# Patient Record
Sex: Male | Born: 1980 | Race: Black or African American | Hispanic: No | Marital: Single | Smoking: Current every day smoker
Health system: Southern US, Community
[De-identification: ages and names within clinical notes are randomized; demographics above are authoritative.]

## PROBLEM LIST (undated history)

## (undated) ENCOUNTER — Emergency Department: Admission: EM | Payer: Self-pay | Source: Home / Self Care

## (undated) DIAGNOSIS — I10 Essential (primary) hypertension: Secondary | ICD-10-CM

## (undated) DIAGNOSIS — E785 Hyperlipidemia, unspecified: Secondary | ICD-10-CM

## (undated) DIAGNOSIS — E119 Type 2 diabetes mellitus without complications: Secondary | ICD-10-CM

## (undated) HISTORY — DX: Hyperlipidemia, unspecified: E78.5

---

## 2012-04-08 ENCOUNTER — Emergency Department: Payer: Self-pay | Admitting: Internal Medicine

## 2012-04-08 LAB — URINALYSIS, COMPLETE
Bilirubin,UR: NEGATIVE
Blood: NEGATIVE
Nitrite: NEGATIVE
Ph: 7 (ref 4.5–8.0)
RBC,UR: 10 /HPF (ref 0–5)
Specific Gravity: 1.028 (ref 1.003–1.030)
WBC UR: 1 /HPF (ref 0–5)

## 2012-04-08 LAB — BASIC METABOLIC PANEL
Anion Gap: 12 (ref 7–16)
Calcium, Total: 8.5 mg/dL (ref 8.5–10.1)
Chloride: 105 mmol/L (ref 98–107)
Co2: 21 mmol/L (ref 21–32)
Creatinine: 1.16 mg/dL (ref 0.60–1.30)
EGFR (African American): >60
EGFR (Non-African Amer.): >60
Glucose: 96 mg/dL (ref 65–99)
Potassium: 3.8 mmol/L (ref 3.5–5.1)
Sodium: 138 mmol/L (ref 136–145)

## 2012-04-08 LAB — CBC WITH DIFFERENTIAL/PLATELET
Basophil %: 0.6 %
Eosinophil #: 0 10*3/uL (ref 0.0–0.7)
Eosinophil %: 0.2 %
Lymphocyte %: 16 %
MCHC: 35.1 g/dL (ref 32.0–36.0)
Monocyte #: 0.7 x10 3/mm (ref 0.2–1.0)
Monocyte %: 6 %
Neutrophil #: 9.5 10*3/uL — ABNORMAL HIGH (ref 1.4–6.5)
Neutrophil %: 77.2 %
RBC: 5.12 10*6/uL (ref 4.40–5.90)
WBC: 12.4 10*3/uL — ABNORMAL HIGH (ref 3.8–10.6)

## 2012-11-12 ENCOUNTER — Emergency Department: Payer: Self-pay | Admitting: Emergency Medicine

## 2012-11-12 LAB — URINALYSIS, COMPLETE
Bilirubin,UR: NEGATIVE
Glucose,UR: NEGATIVE mg/dL (ref 0–75)
Ph: 5 (ref 4.5–8.0)
Protein: 100
RBC,UR: 16 /HPF (ref 0–5)
Squamous Epithelial: NONE SEEN
WBC UR: 2 /HPF (ref 0–5)

## 2012-11-12 LAB — CBC
HGB: 14.3 g/dL (ref 13.0–18.0)
MCH: 28.6 pg (ref 26.0–34.0)
MCHC: 35.7 g/dL (ref 32.0–36.0)
MCV: 80 fL (ref 80–100)
Platelet: 326 10*3/uL (ref 150–440)
RBC: 4.99 10*6/uL (ref 4.40–5.90)
RDW: 16 % — ABNORMAL HIGH (ref 11.5–14.5)
WBC: 13.3 10*3/uL — ABNORMAL HIGH (ref 3.8–10.6)

## 2012-11-12 LAB — COMPREHENSIVE METABOLIC PANEL
Albumin: 3.8 g/dL (ref 3.4–5.0)
Alkaline Phosphatase: 86 U/L (ref 50–136)
Anion Gap: 8 (ref 7–16)
BUN: 10 mg/dL (ref 7–18)
Bilirubin,Total: 1.5 mg/dL — ABNORMAL HIGH (ref 0.2–1.0)
Calcium, Total: 8.9 mg/dL (ref 8.5–10.1)
Co2: 25 mmol/L (ref 21–32)
Creatinine: 1.07 mg/dL (ref 0.60–1.30)
EGFR (Non-African Amer.): >60
Glucose: 90 mg/dL (ref 65–99)
Total Protein: 7.9 g/dL (ref 6.4–8.2)

## 2012-11-12 LAB — LIPASE, BLOOD: Lipase: 140 U/L (ref 73–393)

## 2012-11-14 LAB — URINE CULTURE

## 2014-05-09 IMAGING — US ABDOMEN ULTRASOUND LIMITED
1 series · 14 of 25 positions shown · non-contrast
Comparison: none

REASON FOR EXAM: RUQ pain eval T bili
COMMENTS:   Body Site: GB and Fossa, CBD, Head of Pancreas

PROCEDURE:     US  - US ABDOMEN LIMITED SURVEY  - November 13, 2012  [DATE]
RESULT:     History: Pain.
Comparison Study: CT 04/08/2012.

[Series 1: abdomen ultrasound limited · 0.33mm/px · 14 of 50 slices shown]
[im 1/50]
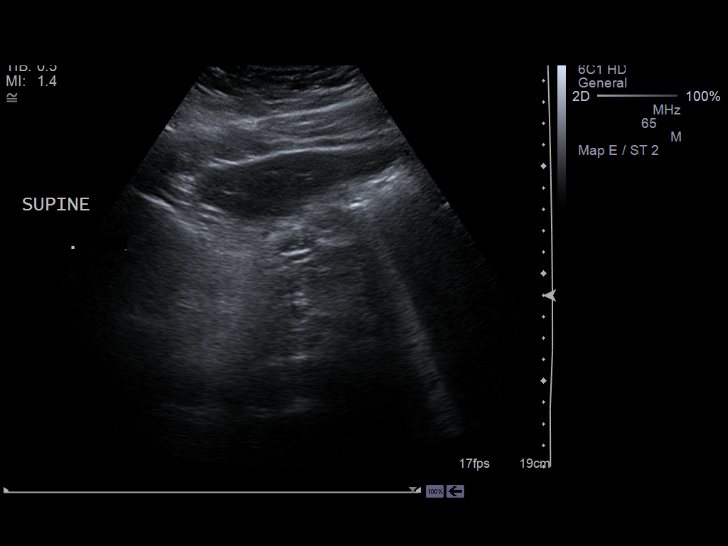
[im 5/50]
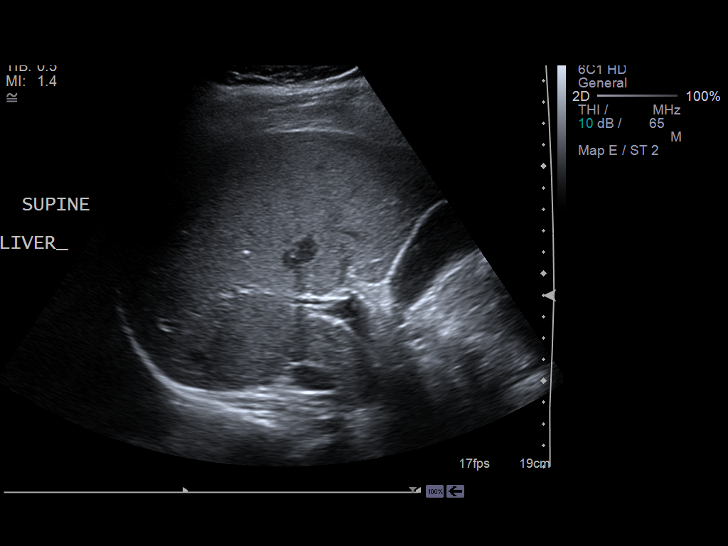
[im 9/50]
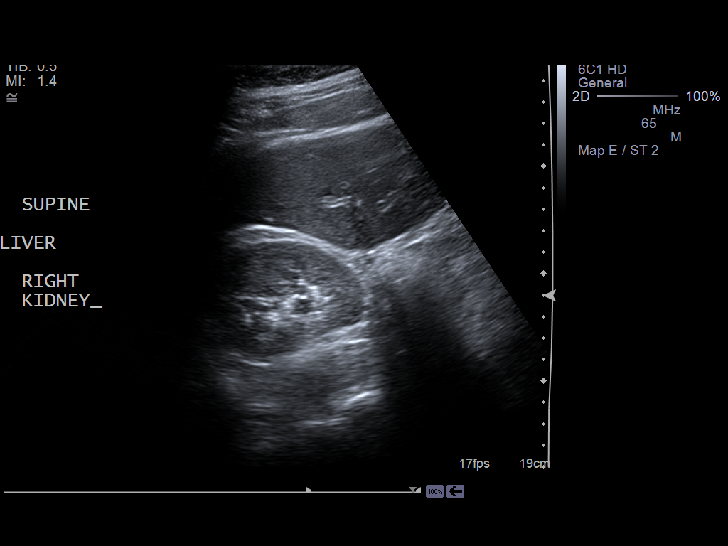
[im 13/50]
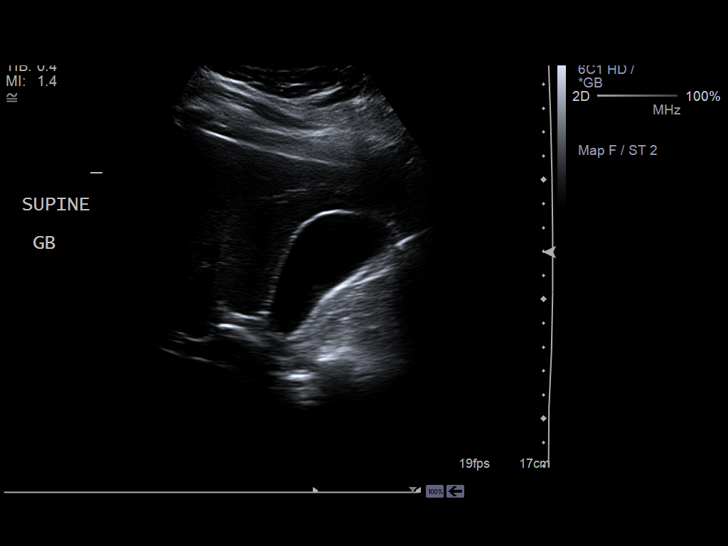
[im 17/50]
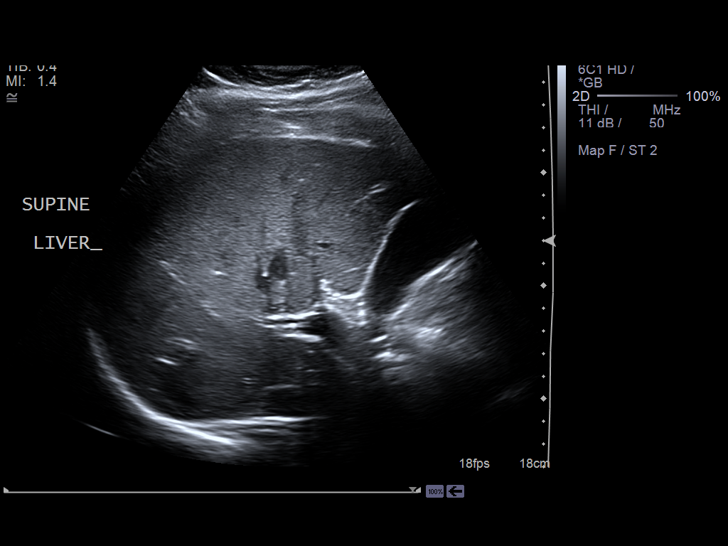
[im 19/50]
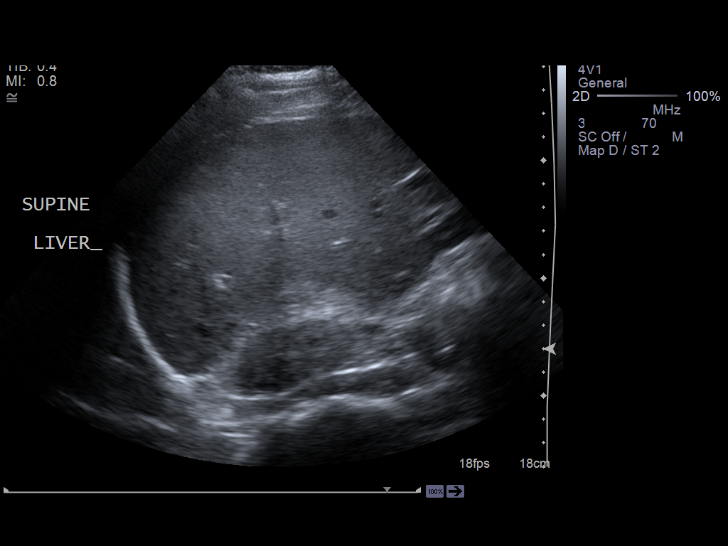
[im 23/50]
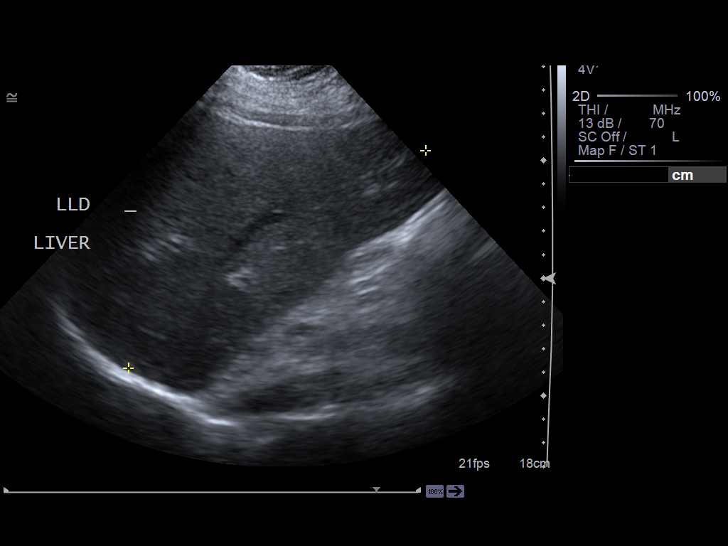
[im 27/50]
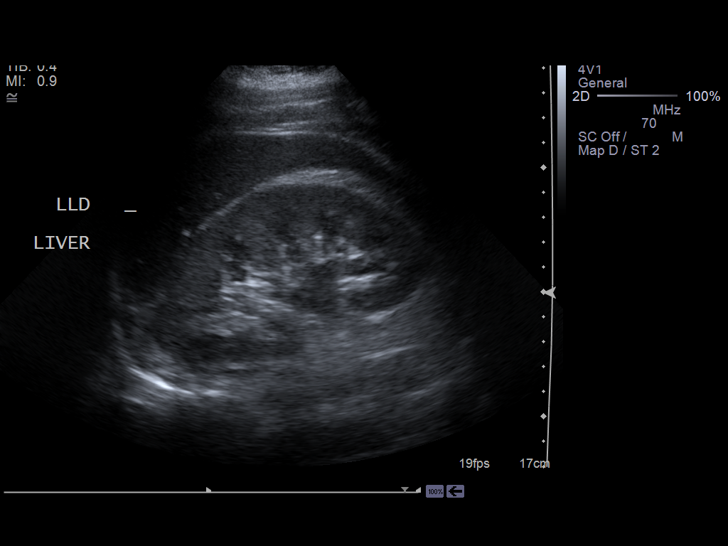
[im 31/50]
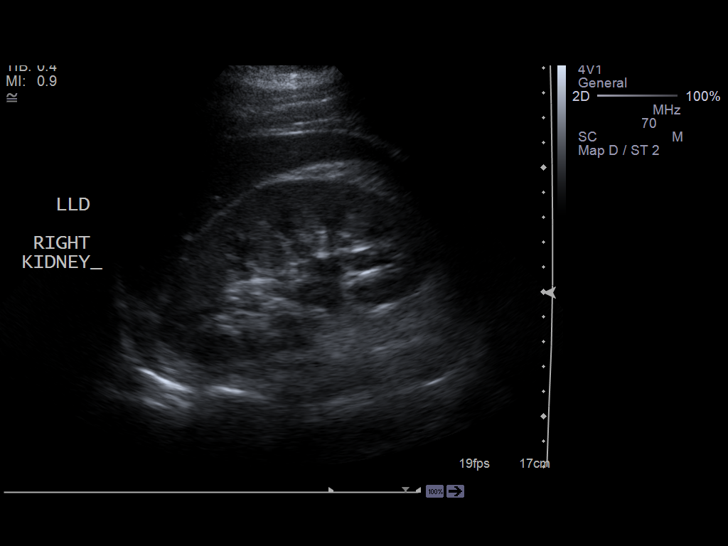
[im 33/50]
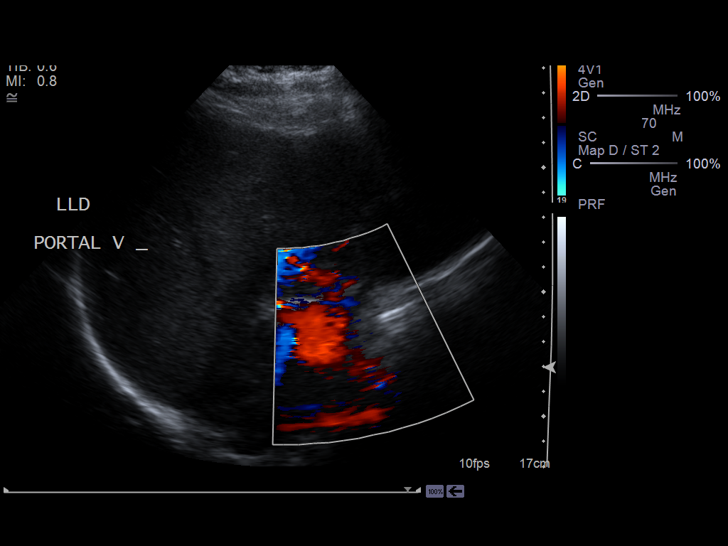
[im 37/50]
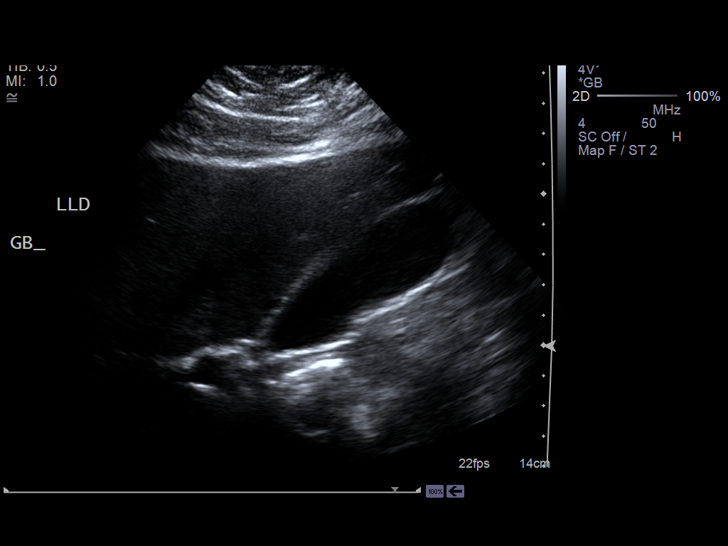
[im 41/50]
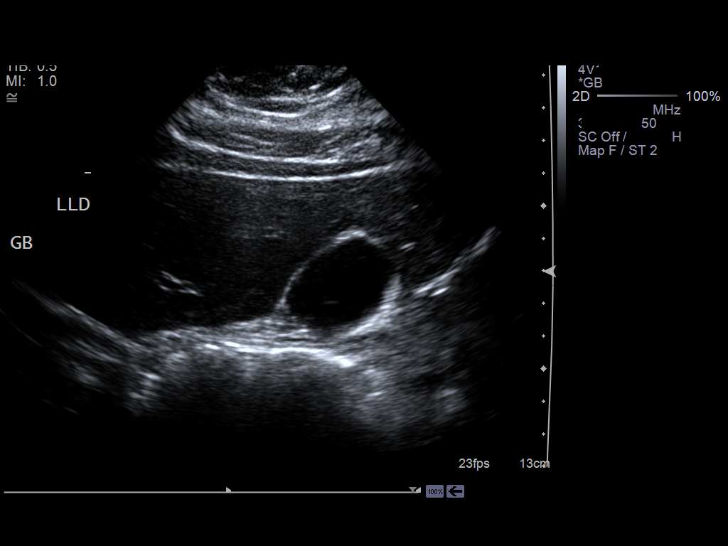
[im 45/50]
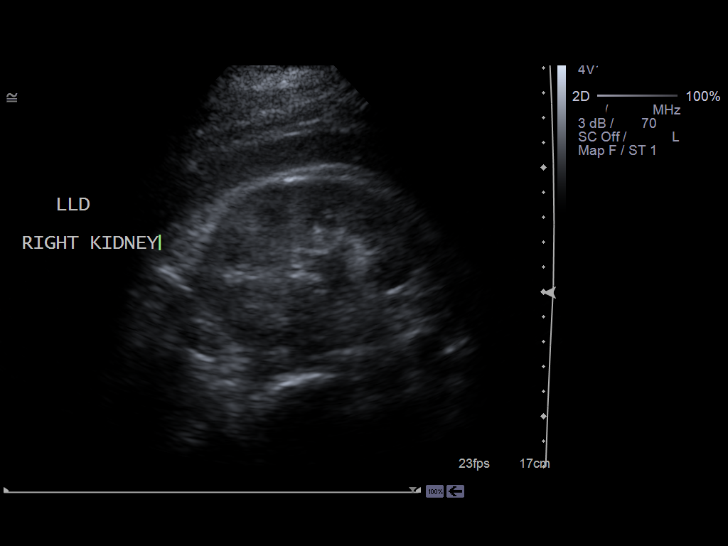
[im 50/50]
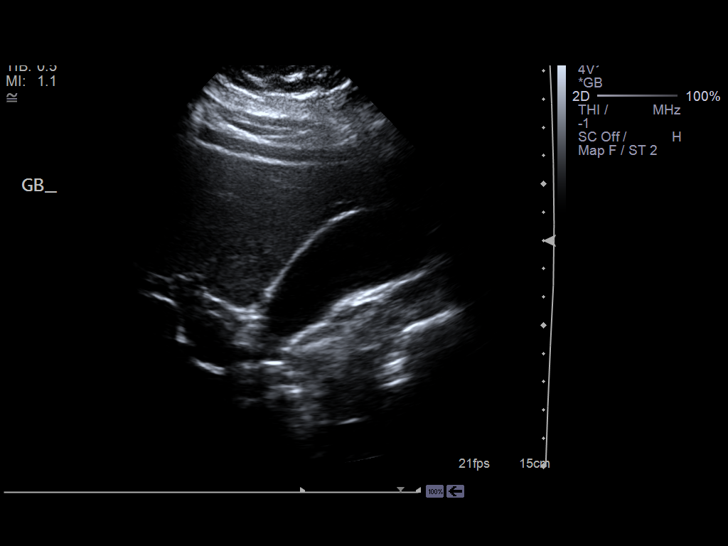

[14 of 25 positions shown; findings below may reference images not displayed]

FINDINGS: Liver normal. Portal vein patent. Pancreas not visualized.
Gallbladder normal. Negative Murphy's sign. Gallbladder wall thickness
mm. Common bile duct caliber 4.3 mm. Mild right hydronephrosis.
IMPRESSION: No acute abnormality. Mild right hydronephrosis.

## 2014-11-03 IMAGING — CT CT ABD-PELV W/ CM
1 of 2 series · 15 of 32 positions shown, 19 images · non-contrast
Comparison: none

REASON FOR EXAM: (1) abd pain with elevated WBC and hematuria; (2) same
COMMENTS:   May transport without cardiac monitor

PROCEDURE:     CT  - CT ABDOMEN / PELVIS  W  - April 08, 2012  [DATE]
RESULT:     CT abdomen and pelvis dated 04/08/2012
TECHNIQUE: Helical 3 mm sections were obtained from lung bases through the
pubic symphysis status post intravenous administration of 100 mL of
Wsovue-QSS and oral contrast.

[Series 2: 3mm soft tissue · axial · 0.86mm/px · z∈[-946,-505]mm · 15 of 162 slices shown, 19 images]
[im 8/162  soft-tissue]
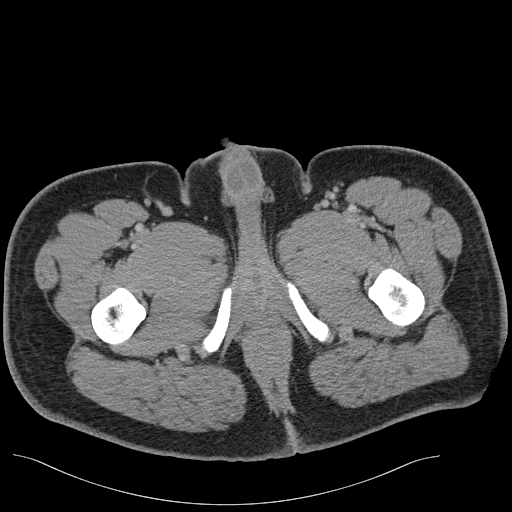
[im 8/162  bone]
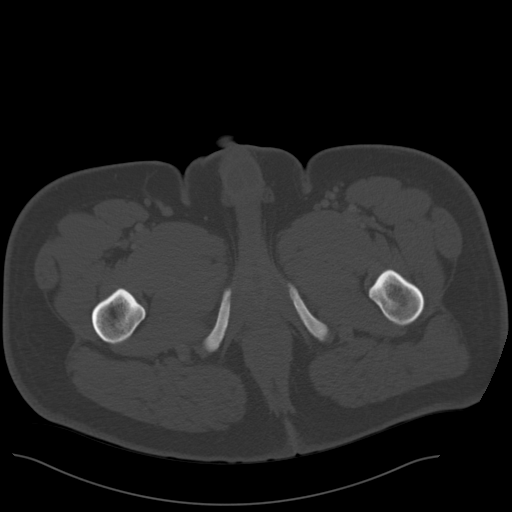
[im 22/162  soft-tissue]
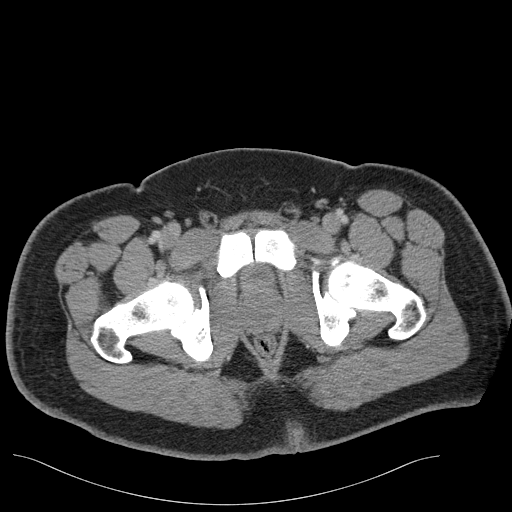
[im 36/162  soft-tissue]
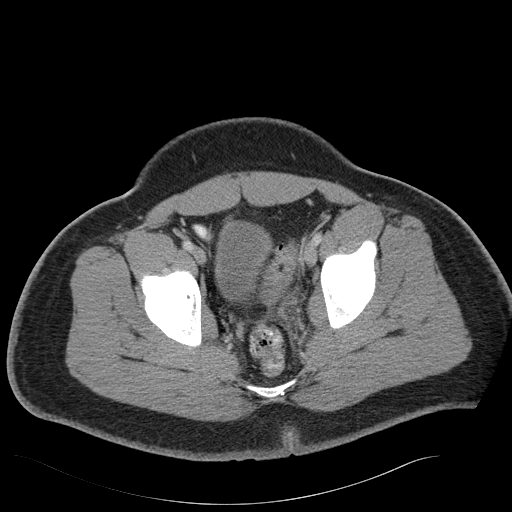
[im 43/162  soft-tissue]
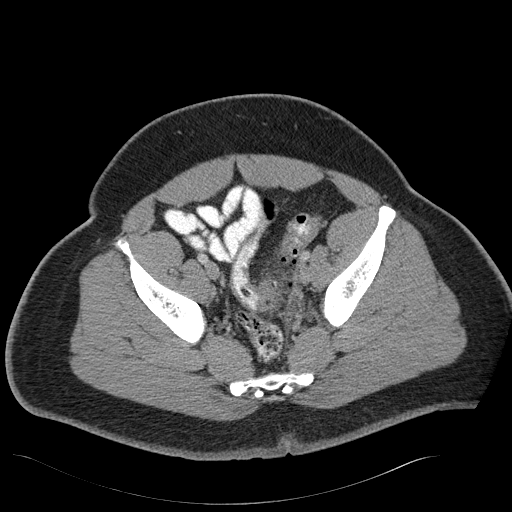
[im 57/162  soft-tissue]
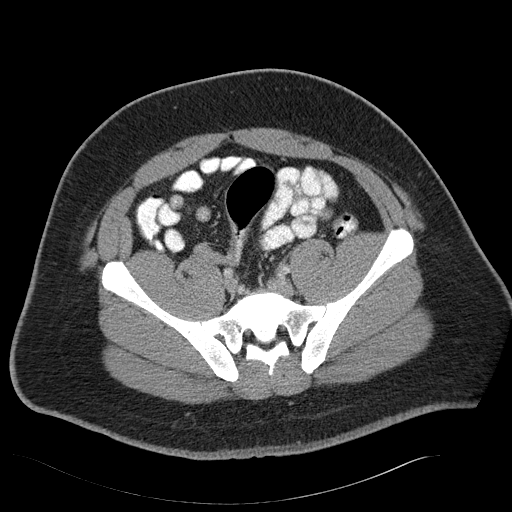
[im 71/162  soft-tissue]
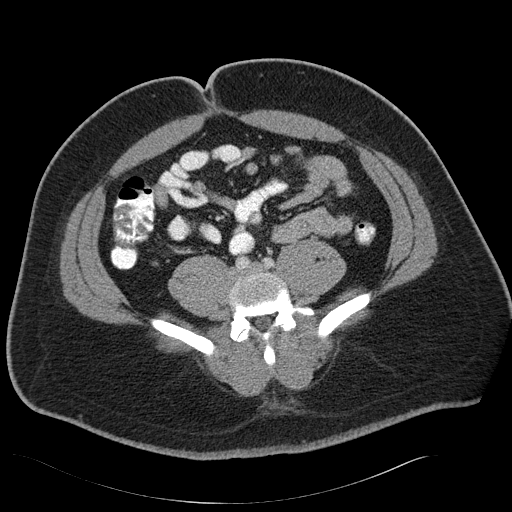
[im 85/162  soft-tissue]
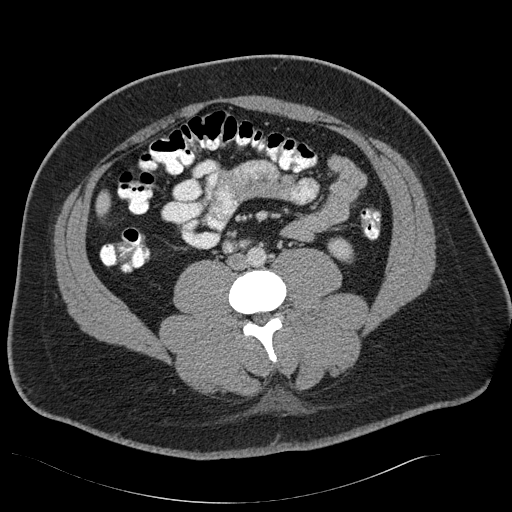
[im 92/162  soft-tissue]
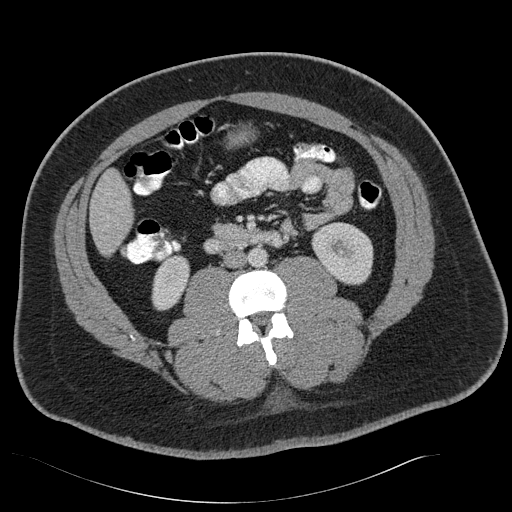
[im 106/162  soft-tissue]
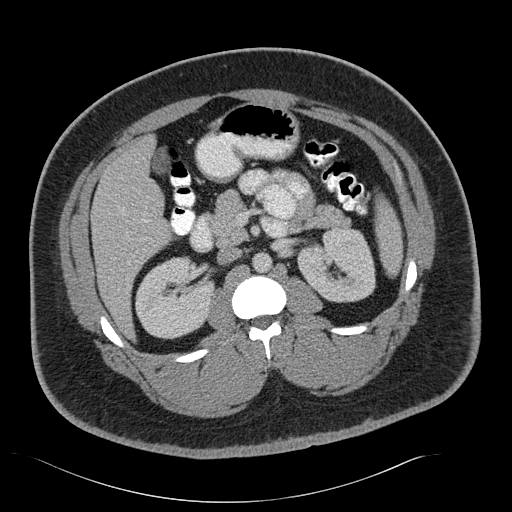
[im 106/162  bone]
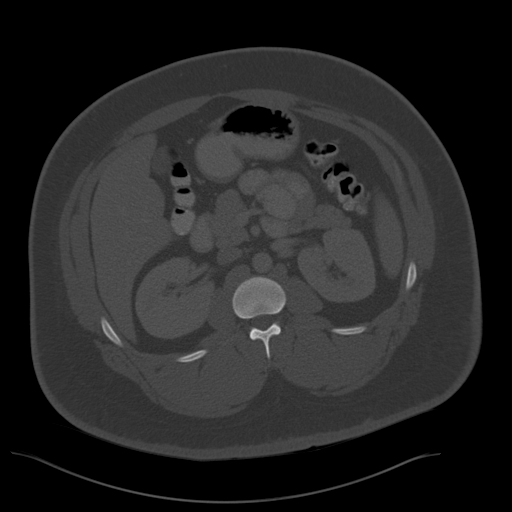
[im 120/162  soft-tissue]
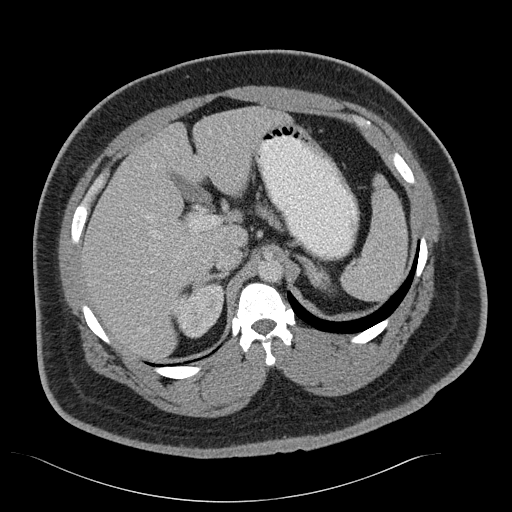
[im 127/162  soft-tissue]
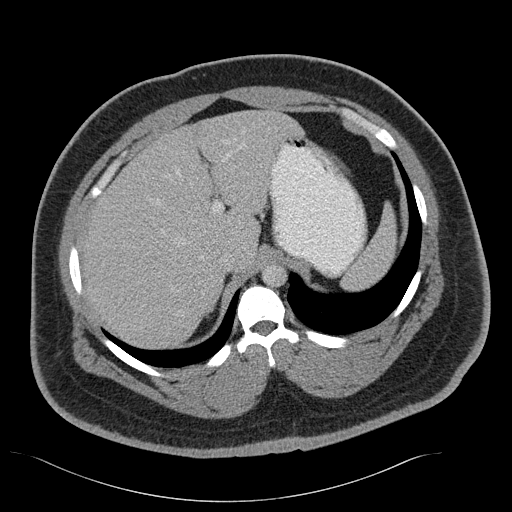
[im 134/162  lung]
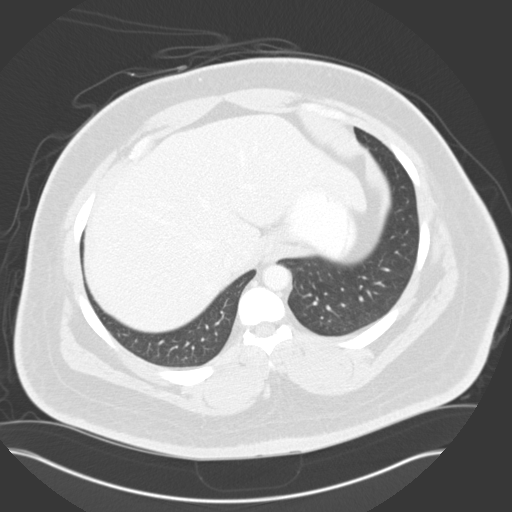
[im 141/162  soft-tissue]
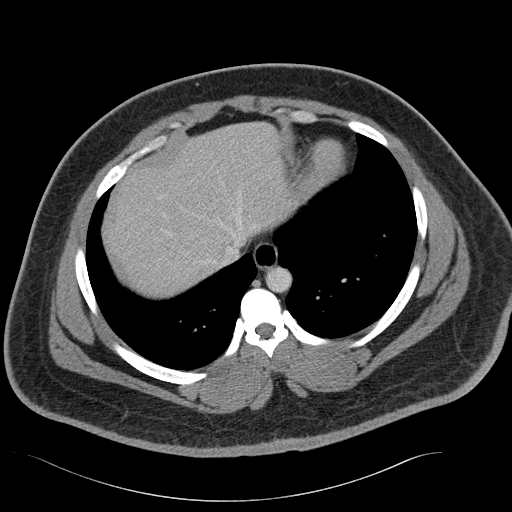
[im 141/162  lung]
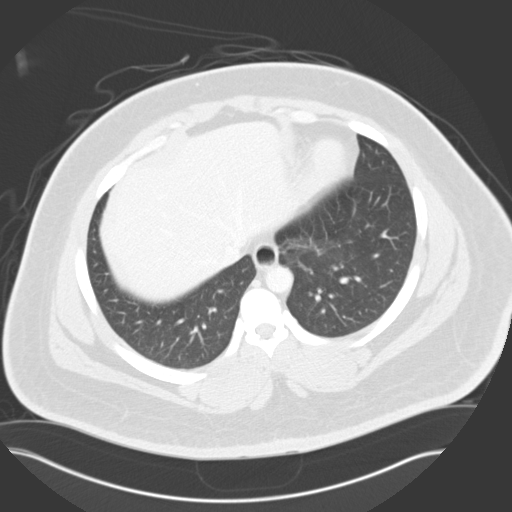
[im 148/162  lung]
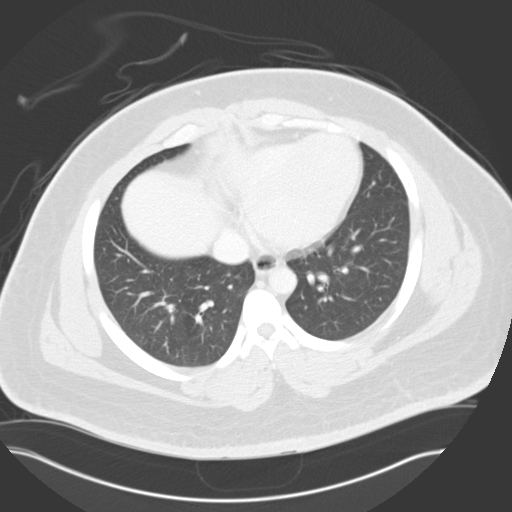
[im 155/162  soft-tissue]
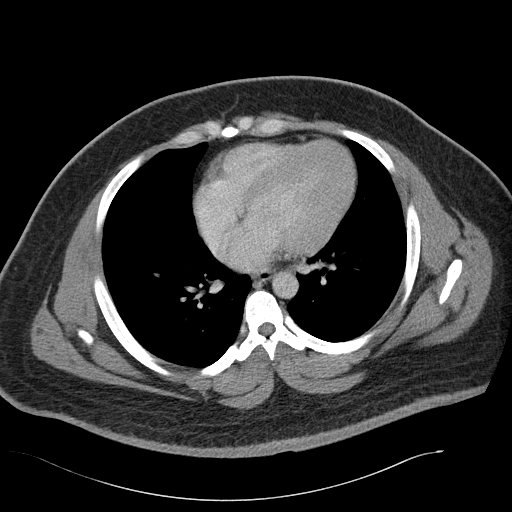
[im 155/162  lung]
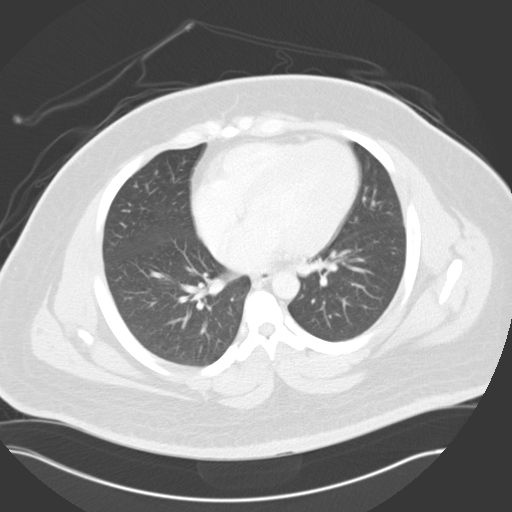

[15 of 32 positions shown; findings below may reference images not displayed]

FINDINGS: Lung bases are unremarkable.

The liver, spleen, adrenals, pancreas is unremarkable. Small 1 to 2 mm
nonobstructing medullary calculi identified within the right and left
kidneys. There is no evidence of hydronephrosis, hydroureter,
nephrolithiasis common nor ureterolithiasis. There is no evidence of bowel
obstruction. There is no evidence of abdominal aortic aneurysm. The celiac,
SMA, IMA, portal vein are opacified.

Evaluation of the pelvis demonstrates diverticulosis within the sigmoid
colon. There is diffuse bowel wall thickening of the mid to right colon with
stranding, inflammatory change, surrounding mesenteric fat as well as a
trace amount fluid. There is no evidence of free air nor associated
loculated fluid collections.
IMPRESSION: Diverticulitis within the sigmoid colon. There is no
evidence of associated abscesses nor free air to suggest perforation.
2. Repeat surveillance evaluation status post appropriate therapeutic
regiment is recommended to ensure resolution of the bowel wall thickening, a
small mass within this area cannot be excluded.

## 2016-07-07 ENCOUNTER — Encounter: Payer: Self-pay | Admitting: Emergency Medicine

## 2016-07-07 ENCOUNTER — Emergency Department
Admission: EM | Admit: 2016-07-07 | Discharge: 2016-07-07 | Disposition: A | Discharge status: Home / Self Care | Payer: Self-pay | Attending: Emergency Medicine | Admitting: Emergency Medicine

## 2016-07-07 DIAGNOSIS — I10 Essential (primary) hypertension: Secondary | ICD-10-CM | POA: Insufficient documentation

## 2016-07-07 DIAGNOSIS — F17200 Nicotine dependence, unspecified, uncomplicated: Secondary | ICD-10-CM | POA: Insufficient documentation

## 2016-07-07 DIAGNOSIS — K0889 Other specified disorders of teeth and supporting structures: Secondary | ICD-10-CM | POA: Insufficient documentation

## 2016-07-07 DIAGNOSIS — E119 Type 2 diabetes mellitus without complications: Secondary | ICD-10-CM | POA: Insufficient documentation

## 2016-07-07 HISTORY — DX: Type 2 diabetes mellitus without complications: E11.9

## 2016-07-07 HISTORY — DX: Essential (primary) hypertension: I10

## 2016-07-07 MED ORDER — PENICILLIN V POTASSIUM 250 MG PO TABS
500.0000 mg | ORAL_TABLET | Freq: Once | ORAL | Status: AC
  Administered 2016-07-07: 500 mg via ORAL
  Filled 2016-07-07: qty 2

## 2016-07-07 MED ORDER — TRAMADOL HCL 50 MG PO TABS: 50.0000 mg | ORAL_TABLET | Freq: Four times a day (QID) | ORAL | 0 refills | Status: AC | PRN

## 2016-07-07 MED ORDER — PENICILLIN V POTASSIUM 500 MG PO TABS: 500.0000 mg | ORAL_TABLET | Freq: Four times a day (QID) | ORAL | 0 refills | Status: DC

## 2016-07-07 MED ORDER — BUPIVACAINE HCL (PF) 0.5 % IJ SOLN
10.0000 mL | Freq: Once | INTRAMUSCULAR | Status: AC
  Administered 2016-07-07: 10 mL
  Filled 2016-07-07: qty 30

## 2016-07-07 NOTE — ED Notes (Signed)

## 2016-07-07 NOTE — ED Notes (Signed)
Pt presents to ED 04 c/o of pain to the right side of the face to the upper molar and associated gums for the last week; pt denies any bleeding or discharge; no abscess to the teeth apparent upon inspection; pt states difficulty chewing resulting from pain when trying to bite down on food on that side; pt states pain at 7/10; pt is able to speak in complete sentences; denies any difficulty breathing and pt's speech is clear; pt is alert and oriented x4

## 2016-07-07 NOTE — ED Provider Notes (Signed)
[facility] Emergency Department Provider Note  ____________________________________________   I have reviewed the triage vital signs and the nursing notes.   HISTORY  Chief Complaint Oral Swelling and Dental Pain   History limited by: Not Limited   HPI Russell Ross is a 36 y.o. male who presents to the emergency department today because of concerns for facial swelling and teeth pain. Patient states he has tooth pain to his right upper teeth for the past year. He only started having the swelling today although the pain has been worse the past week. Denies seeing any dentist for this issue. He has not had any fevers, nausea or vomiting.   Past Medical History:  Diagnosis Date  . Diabetes mellitus without complication (HCC)   . Hypertension     There are no active problems to display for this patient.   History reviewed. No pertinent surgical history.  Prior to Admission medications   Not on File    Allergies Patient has no known allergies.  History reviewed. No pertinent family history.  Social History Social History  Substance Use Topics  . Smoking status: Current Every Day Smoker  . Smokeless tobacco: Never Used  . Alcohol use Yes    Review of Systems  Constitutional: Negative for fever. Cardiovascular: Negative for chest pain. Respiratory: Negative for shortness of breath. Gastrointestinal: Negative for abdominal pain, vomiting and diarrhea. Neurological: Negative for headaches, focal weakness or numbness.  10-point ROS otherwise negative.  ____________________________________________   PHYSICAL EXAM:  VITAL SIGNS: ED Triage Vitals  Enc Vitals Group     BP 07/07/16 1942 (!) 149/93     Pulse Rate 07/07/16 1942 79     Resp 07/07/16 1942 18     Temp 07/07/16 1942 98.6 F (37 C)     Temp Source 07/07/16 1942 Oral     SpO2 07/07/16 1942 96 %     Weight 07/07/16 1942 276 lb (125.2 kg)     Height 07/07/16 1942   (1.778 m)     Head Circumference --      Peak Flow --      Pain Score 07/07/16 1941 9   Constitutional: Alert and oriented. Well appearing and in no distress. Eyes: Conjunctivae are normal. Normal extraocular movements. ENT   Head: Normocephalic and atraumatic.   Nose: No congestion/rhinnorhea.   Mouth/Throat: Mucous membranes are moist. Tender to palpation of upper right molars with some mild swelling noted to cheek. No fluctuance appreciated.    Neck: No stridor. Hematological/Lymphatic/Immunilogical: No cervical lymphadenopathy. Cardiovascular: Normal rate, regular rhythm.  No murmurs, rubs, or gallops. Respiratory: Normal respiratory effort without tachypnea nor retractions. Breath sounds are clear and equal bilaterally. No wheezes/rales/rhonchi. Genitourinary: Deferred Musculoskeletal: Normal range of motion in all extremities.  Neurologic:  Normal speech and language. No gross focal neurologic deficits are appreciated.  Skin:  Skin is warm, dry and intact. No rash noted. Psychiatric: Mood and affect are normal. Speech and behavior are normal. Patient exhibits appropriate insight and judgment.  ____________________________________________    LABS (pertinent positives/negatives)  None  ____________________________________________   EKG  None  ____________________________________________    RADIOLOGY  None   ____________________________________________   PROCEDURES  Procedures  NERVE BLOCK Performed by: Phineas Semen Consent: Verbal consent obtained. Required items: required blood products, implants, devices, and special equipment available  Indication: Dental Pain Nerve block body site: Right upper molar  Needle gauge: 25 G Location technique: anatomical landmarks  Local anesthetic: 0.5% bupivacaine  Anesthetic  total: 2 ml  Outcome: pain improved Patient tolerance: Patient tolerated the procedure well with no immediate  complications.  ____________________________________________   INITIAL IMPRESSION / ASSESSMENT AND PLAN / ED COURSE  Pertinent labs & imaging results that were available during my care of the patient were reviewed by me and considered in my medical decision making (see chart for details). Patient presented to the emergency department today because of concerns for dental pain and swelling. On exam patient did have some tenderness to palpation of the right upper molars. No obvious fluctuance appreciated. Patient underwent dental block with relief of pain. Will discharge patient with penicillin and pain medication. Will give patient dental follow-up options.  ____________________________________________   FINAL CLINICAL IMPRESSION(S) / ED DIAGNOSES  Final diagnoses:  Pain, dental     Note: This dictation was prepared with Dragon dictation. Any transcriptional errors that result from this process are unintentional     Phineas Semen, MD 07/07/16 2153

## 2016-07-07 NOTE — ED Triage Notes (Signed)
Pt ambulatory to triage in NAD, report dental pain to right upper tooth with facial swelling x 1 week.  States has not seen a Education officer, community or MD about it.

## 2016-07-07 NOTE — Discharge Instructions (Signed)
OPTIONS FOR DENTAL FOLLOW UP CARE ° °[place] Department of Health and Human Services - Local Safety Net Dental Clinics °http://www.ncdhhs.gov/dph/oralhealth/services/safetynetclinics.htm °  °[facility] (336-562-3123) ° °[facility] ([phone]) ° °[facility] ([phone] ext [phone]) ° °[facility] ([phone]) ° °[facility] ([phone]) °This clinic caters to the indigent population and is on a lottery system. °Location: °[facility], [address], [place] °Clinic Hours: °Wednesdays from 6pm - 9pm, patients seen by a lottery system. °For dates, call or go to [email or web address] °Services: °Cleanings, fillings and simple extractions. °Payment Options: °DENTAL WORK IS FREE OF CHARGE. Bring proof of income or support. °Best way to get seen: °Arrive at 5:15 pm - this is a lottery, NOT first come/first serve, so arriving earlier will not increase your chances of being seen. °  °  °[facility] °[phone] °Select option 1 for emergencies °  °Location: °[facility], [address], [place] °Clinic Hours: °No walk-ins accepted - call the day before to schedule an appointment. °Check in times are 9:30 am and 1:30 pm. °Services: °Simple extractions, temporary fillings, pulpectomy/pulp debridement, uncomplicated abscess drainage. °Payment Options: °PAYMENT IS DUE AT THE TIME OF SERVICE.  Fee is usually $100-200, additional surgical procedures (e.g. abscess drainage) may be extra. °Cash, checks, Visa/MasterCard accepted.  Can file Medicaid if patient is covered for dental - patient should call case worker to check. °No discount for [facility] patients. °Best way to get seen: °MUST call the day before and get onto the schedule. Can usually be seen the next 1-2 days. No walk-ins accepted. °  °  °[facility] °[phone] °   °Location: °[facility], [address], [place] °Clinic Hours: °M, W, Th, F 8am or 1:30pm, Tues 9a or 1:30 - first come/first served. °Services: °Simple extractions, temporary fillings, uncomplicated abscess drainage.  You do not need to be an [place] resident. °Payment Options: °PAYMENT IS DUE AT THE TIME OF SERVICE. °Dental insurance, otherwise sliding scale - bring proof of income or support. °Depending on income and treatment needed, cost is usually $50-200. °Best way to get seen: °Arrive early as it is first come/first served. °  °  °[facility] °[phone] °  °Location: °[address] °Clinic Hours: °Mon-Thu 8a-5p °Services: °Most basic dental services including extractions and fillings. °Payment Options: °PAYMENT IS DUE AT THE TIME OF SERVICE. °Sliding scale, up to 50% off - bring proof if income or support. °Medicaid with dental option accepted. °Best way to get seen: °Call to schedule an appointment, can usually be seen within 2 weeks OR they will try to see walk-ins - show up at 8a or 2p (you may have to wait). °  °  °[facility] °[phone] °[place] RESIDENTS ONLY °  °Location: °[facility], [address], [place], [place] [address] °Clinic Hours: By appointment only. °Monday - Thursday 8am-5pm, Friday 8am-12pm °Services: Cleanings, fillings, extractions. °Payment Options: °PAYMENT IS DUE AT THE TIME OF SERVICE. °Cash, Visa or MasterCard. Sliding scale - $30 minimum per service. °Best way to get seen: °Come in to office, complete packet and make an appointment - need proof of income °or support monies for each household member and proof of [place] residence. °Usually takes about a month to get in. °  °  °[facility] °[phone] °  °Location: °[address]   [place] °Clinic Hours: Walk-in Urgent Care Dental Services are offered Monday-Friday  mornings only. °The numbers of emergencies accepted daily is limited to the number of °providers available. °Maximum 15 - Mondays, Wednesdays & Thursdays °Maximum 10 - Tuesdays & Fridays °Services: °You do not need to be a [place] resident to be seen for a dental emergency. °Emergencies are defined as pain, swelling, abnormal bleeding, or dental trauma. Walkins will receive x-rays if needed. °NOTE: Dental cleaning is not an emergency. °Payment Options: °PAYMENT IS DUE AT THE TIME OF SERVICE. °Minimum co-pay is $40.00 for uninsured patients. °Minimum co-pay is $3.00 for Medicaid with dental coverage. °Dental Insurance is accepted and must be presented at time of visit. °Medicare does not cover dental. °Forms of payment: Cash, credit card, checks. °Best way to get seen: °If not previously registered with the clinic, walk-in dental registration begins at 7:15 am and is on a first come/first serve basis. °If previously registered with the clinic, call to make an appointment. °  °  °[facility] °[phone] °[place] RESIDENTS ONLY °  °Location: °[address], [place], [place] °Clinic Hours: °Mon-Thu 10a-2p °Services: Extractions only! °Payment Options: °FREE (donations accepted) - bring proof of income or support °Best way to get seen: °Call and schedule an appointment OR come at 8am on the 1st Monday of every month (except for holidays) when it is first come/first served. °  °  °[facility] °[phone] °  °Location: °[address], [place] °Clinic Hours: °Friday mornings °Services, Payment Options, Best way to get seen: °Call for info °

## 2018-05-17 ENCOUNTER — Other Ambulatory Visit
Admission: RE | Admit: 2018-05-17 | Discharge: 2018-05-17 | Disposition: A | Discharge status: Home / Self Care | Payer: Self-pay | Attending: Emergency Medicine | Admitting: Emergency Medicine

## 2018-05-17 NOTE — ED Notes (Signed)
Pt arrived to ED for forensic blood draw with [employer]. Officer Pride informed pt of warrant for blood draw. Pt verbalized understanding. No issues with pt. Appeared calm and cooperative.

## 2020-02-20 ENCOUNTER — Emergency Department
Admission: EM | Admit: 2020-02-20 | Discharge: 2020-02-20 | Disposition: A | Discharge status: Home / Self Care | Payer: Self-pay | Attending: Student in an Organized Health Care Education/Training Program | Admitting: Student in an Organized Health Care Education/Training Program

## 2020-02-20 ENCOUNTER — Other Ambulatory Visit: Payer: Self-pay

## 2020-02-20 DIAGNOSIS — F17200 Nicotine dependence, unspecified, uncomplicated: Secondary | ICD-10-CM | POA: Insufficient documentation

## 2020-02-20 DIAGNOSIS — K648 Other hemorrhoids: Secondary | ICD-10-CM | POA: Insufficient documentation

## 2020-02-20 DIAGNOSIS — K625 Hemorrhage of anus and rectum: Secondary | ICD-10-CM | POA: Insufficient documentation

## 2020-02-20 DIAGNOSIS — E119 Type 2 diabetes mellitus without complications: Secondary | ICD-10-CM | POA: Insufficient documentation

## 2020-02-20 DIAGNOSIS — I10 Essential (primary) hypertension: Secondary | ICD-10-CM | POA: Insufficient documentation

## 2020-02-20 LAB — CBC
HCT: 45.6 % (ref 39.0–52.0)
Hemoglobin: 16.5 g/dL (ref 13.0–17.0)
MCH: 29.5 pg (ref 26.0–34.0)
MCHC: 36.2 g/dL — ABNORMAL HIGH (ref 30.0–36.0)
MCV: 81.6 fL (ref 80.0–100.0)
Platelets: 346 10*3/uL (ref 150–400)
RBC: 5.59 MIL/uL (ref 4.22–5.81)
RDW: 13.3 % (ref 11.5–15.5)
WBC: 6.5 10*3/uL (ref 4.0–10.5)
nRBC: 0 % (ref 0.0–0.2)

## 2020-02-20 LAB — COMPREHENSIVE METABOLIC PANEL
ALT: 30 U/L (ref 0–44)
AST: 37 U/L (ref 15–41)
Albumin: 4 g/dL (ref 3.5–5.0)
Alkaline Phosphatase: 68 U/L (ref 38–126)
Anion gap: 11 (ref 5–15)
BUN: 17 mg/dL (ref 6–20)
CO2: 24 mmol/L (ref 22–32)
Calcium: 8.4 mg/dL — ABNORMAL LOW (ref 8.9–10.3)
Chloride: 101 mmol/L (ref 98–111)
Creatinine, Ser: 1.24 mg/dL (ref 0.61–1.24)
GFR, Estimated: >60 mL/min (ref >60)
Glucose, Bld: 230 mg/dL — ABNORMAL HIGH (ref 70–99)
Potassium: 3.7 mmol/L (ref 3.5–5.1)
Sodium: 136 mmol/L (ref 135–145)
Total Bilirubin: 0.9 mg/dL (ref 0.3–1.2)
Total Protein: 7.8 g/dL (ref 6.5–8.1)

## 2020-02-20 MED ORDER — HYDROCORTISONE (PERIANAL) 2.5 % EX CREA: 1.0000 "application " | TOPICAL_CREAM | Freq: Two times a day (BID) | CUTANEOUS | 0 refills | Status: DC

## 2020-02-20 NOTE — ED Provider Notes (Signed)
[facility] Emergency Department Provider Note    First MD Initiated Contact with Patient 02/20/20 1312     (approximate)  I have reviewed the triage vital signs and the nursing notes.   HISTORY  Chief Complaint Rectal Bleeding    HPI Russell Ross is a 39 y.o. male the below listed past medical history presents to the ER for evaluation of bright red blood per rectum since yesterday.  States that he wipes he did notice large amount of blood was bright red.  No melena.  Is never had symptoms like this before.  Also having some pain associated with wiping as well as go #2.  Denies any abdominal pain.  No chest pain.  No lightheadedness no chest pain or dizziness.  Past Medical History:  Diagnosis Date  . Diabetes mellitus without complication (HCC)   . Hypertension    No family history on file. No past surgical history on file. There are no problems to display for this patient.     Prior to Admission medications   Medication Sig Start Date End Date Taking? Authorizing Provider  hydrocortisone (ANUSOL-HC) 2.5 % rectal cream Place 1 application rectally 2 (two) times daily. 02/20/20   Willy Eddy, MD  penicillin v potassium (VEETID) 500 MG tablet Take 1 tablet (500 mg total) by mouth 4 (four) times daily. 07/07/16   Phineas Semen, MD    Allergies Patient has no known allergies.    Social History Social History   Tobacco Use  . Smoking status: Current Every Day Smoker  . Smokeless tobacco: Never Used  Substance Use Topics  . Alcohol use: Yes  . Drug use: Not on file    Review of Systems Patient denies headaches, rhinorrhea, blurry vision, numbness, shortness of breath, chest pain, edema, cough, abdominal pain, nausea, vomiting, diarrhea, dysuria, fevers, rashes or hallucinations unless otherwise stated above in HPI. ____________________________________________   PHYSICAL EXAM:  VITAL SIGNS: Vitals:   02/20/20 1143 02/20/20  1315  BP: 122/72 (!) 152/84  Pulse: 77 77  Resp: 18 18  Temp: 98.6 F (37 C)   SpO2: 95% 95%    Constitutional: Alert and oriented. Well appearing and in no acute distress. Eyes: Conjunctivae are normal.  Head: Atraumatic. Nose: No congestion/rhinnorhea. Mouth/Throat: Mucous membranes are moist.   Neck: Painless ROM.  Cardiovascular:   Good peripheral circulation. Respiratory: Normal respiratory effort.  No retractions.  Gastrointestinal: Soft and nontender.  Rectal exam with several hemorrhoids.  Light brown stool on DRE.  No melena or hematochezia.  No masses.  No fluctuance. Musculoskeletal: No lower extremity tenderness .  No joint effusions. Neurologic:  Normal speech and language. No gross focal neurologic deficits are appreciated.  Skin:  Skin is warm, dry and intact. No rash noted. Psychiatric: Mood and affect are normal. Speech and behavior are normal.  ____________________________________________   LABS (all labs ordered are listed, but only abnormal results are displayed)  Results for orders placed or performed during the hospital encounter of 02/20/20 (from the past 24 hour(s))  Comprehensive metabolic panel     Status: Abnormal   Collection Time: 02/20/20 11:48 AM  Result Value Ref Range   Sodium 136 135 - 145 mmol/L   Potassium 3.7 3.5 - 5.1 mmol/L   Chloride 101 98 - 111 mmol/L   CO2 24 22 - 32 mmol/L   Glucose, Bld 230 (H) 70 - 99 mg/dL   BUN 17 6 - 20 mg/dL   Creatinine, Ser 3.84 0.61 -  1.24 mg/dL   Calcium 8.4 (L) 8.9 - 10.3 mg/dL   Total Protein 7.8 6.5 - 8.1 g/dL   Albumin 4.0 3.5 - 5.0 g/dL   AST 37 15 - 41 U/L   ALT 30 0 - 44 U/L   Alkaline Phosphatase 68 38 - 126 U/L   Total Bilirubin 0.9 0.3 - 1.2 mg/dL   GFR, Estimated >44 >81 mL/min   Anion gap 11 5 - 15  CBC     Status: Abnormal   Collection Time: 02/20/20 11:48 AM  Result Value Ref Range   WBC 6.5 4.0 - 10.5 K/uL   RBC 5.59 4.22 - 5.81 MIL/uL   Hemoglobin 16.5 13.0 - 17.0 g/dL   HCT  85.6 39 - 52 %   MCV 81.6 80.0 - 100.0 fL   MCH 29.5 26.0 - 34.0 pg   MCHC 36.2 (H) 30.0 - 36.0 g/dL   RDW 31.4 97.0 - 26.3 %   Platelets 346 150 - 400 K/uL   nRBC 0.0 0.0 - 0.2 %   ____________________________________________  EKG____________________________________________  RADIOLOGY   ____________________________________________   PROCEDURES  Procedure(s) performed:  Procedures    Critical Care performed: no ____________________________________________   INITIAL IMPRESSION / ASSESSMENT AND PLAN / ED COURSE  Pertinent labs & imaging results that were available during my care of the patient were reviewed by me and considered in my medical decision making (see chart for details).  DDX: Hemorrhoid, anal fissure, upper GI bleed, diverticular, mass  Russell Ross is a 39 y.o. who presents to the ED with presentation as described above.  Presentation consistent with hemorrhoids.  Hemoglobin stable.  No sign of bleeding.  Not consistent with upper GI bleed.  Will treat empirically.  Imaging not clinically indicated based on presentation.  Will be giving conservative treatment discussed signs and symptoms for which he should return to the ER.      ____________________________________________   FINAL CLINICAL IMPRESSION(S) / ED DIAGNOSES  Final diagnoses:  Rectal bleeding  Other hemorrhoids      NEW MEDICATIONS STARTED DURING THIS VISIT:  New Prescriptions   HYDROCORTISONE (ANUSOL-HC) 2.5 % RECTAL CREAM    Place 1 application rectally 2 (two) times daily.     Note:  This document was prepared using Dragon voice recognition software and may include unintentional dictation errors.     Willy Eddy, MD 02/20/20 1326

## 2020-02-20 NOTE — ED Notes (Signed)
This RN to bedside at this time. Pt states painful bright red blood coming from rectum after having a bowel movement. Pt states it has happened four times since yesterday.   Pt also states upper abdominal pain since yesterday.   Pt states he's been "getting dizzy for 2 weeks"

## 2020-02-20 NOTE — ED Triage Notes (Signed)
Pt to ER with c/o noted rectal bleeding only when wiping since yesterday.  Pt denies abdominal pain or blood in toilet.

## 2020-02-20 NOTE — ED Notes (Signed)
Pt and family verbalized understanding of d/c instructions at this time and stated they have no further questions at this time.

## 2020-02-20 NOTE — ED Notes (Signed)
EDP Robinson at bedside.  

## 2022-04-11 ENCOUNTER — Encounter: Payer: Self-pay | Admitting: Emergency Medicine

## 2022-04-11 ENCOUNTER — Emergency Department: Payer: Self-pay

## 2022-04-11 ENCOUNTER — Emergency Department
Admission: EM | Admit: 2022-04-11 | Discharge: 2022-04-11 | Disposition: A | Discharge status: Home / Self Care | Payer: Self-pay | Attending: Emergency Medicine | Admitting: Emergency Medicine

## 2022-04-11 ENCOUNTER — Other Ambulatory Visit: Payer: Self-pay

## 2022-04-11 DIAGNOSIS — R0789 Other chest pain: Secondary | ICD-10-CM | POA: Insufficient documentation

## 2022-04-11 DIAGNOSIS — R079 Chest pain, unspecified: Secondary | ICD-10-CM

## 2022-04-11 LAB — COMPREHENSIVE METABOLIC PANEL
ALT: 37 U/L (ref 0–44)
AST: 49 U/L — ABNORMAL HIGH (ref 15–41)
Albumin: 4.5 g/dL (ref 3.5–5.0)
Alkaline Phosphatase: 61 U/L (ref 38–126)
Anion gap: 9 (ref 5–15)
BUN: 17 mg/dL (ref 6–20)
CO2: 26 mmol/L (ref 22–32)
Calcium: 9 mg/dL (ref 8.9–10.3)
Chloride: 103 mmol/L (ref 98–111)
Creatinine, Ser: 1.78 mg/dL — ABNORMAL HIGH (ref 0.61–1.24)
GFR, Estimated: 49 mL/min — ABNORMAL LOW (ref >60)
Glucose, Bld: 159 mg/dL — ABNORMAL HIGH (ref 70–99)
Potassium: 4 mmol/L (ref 3.5–5.1)
Sodium: 138 mmol/L (ref 135–145)
Total Bilirubin: 0.8 mg/dL (ref 0.3–1.2)
Total Protein: 8.3 g/dL — ABNORMAL HIGH (ref 6.5–8.1)

## 2022-04-11 LAB — CBC WITH DIFFERENTIAL/PLATELET
Abs Immature Granulocytes: 0.02 10*3/uL (ref 0.00–0.07)
Basophils Absolute: 0.1 10*3/uL (ref 0.0–0.1)
Basophils Relative: 1 %
Eosinophils Absolute: 0.1 10*3/uL (ref 0.0–0.5)
Eosinophils Relative: 1 %
HCT: 48.2 % (ref 39.0–52.0)
Hemoglobin: 16.8 g/dL (ref 13.0–17.0)
Immature Granulocytes: 0 %
Lymphocytes Relative: 30 %
Lymphs Abs: 1.9 10*3/uL (ref 0.7–4.0)
MCH: 28.5 pg (ref 26.0–34.0)
MCHC: 34.9 g/dL (ref 30.0–36.0)
MCV: 81.7 fL (ref 80.0–100.0)
Monocytes Absolute: 0.4 10*3/uL (ref 0.1–1.0)
Monocytes Relative: 7 %
Neutro Abs: 3.8 10*3/uL (ref 1.7–7.7)
Neutrophils Relative %: 61 %
Platelets: 286 10*3/uL (ref 150–400)
RBC: 5.9 MIL/uL — ABNORMAL HIGH (ref 4.22–5.81)
RDW: 13.9 % (ref 11.5–15.5)
WBC: 6.4 10*3/uL (ref 4.0–10.5)
nRBC: 0 % (ref 0.0–0.2)

## 2022-04-11 LAB — TROPONIN I (HIGH SENSITIVITY)
Troponin I (High Sensitivity): 17 ng/L (ref <18)
Troponin I (High Sensitivity): 16 ng/L (ref <18)

## 2022-04-11 MED ORDER — ASPIRIN 81 MG PO TBEC: 81.0000 mg | DELAYED_RELEASE_TABLET | Freq: Every day | ORAL | 2 refills | Status: DC

## 2022-04-11 NOTE — ED Triage Notes (Signed)
Pt presents via [facility] with complaints of mid-sternal CP that started around 0200 but has resolved PTA. Pt has had a cough in the past few days and has been taking Mucinex. Pt received 324mg  of ASA PTA with EMS. Denies CP at this time or SOB. Pt is A&O x4 at this time and in no distress in triage.

## 2022-04-11 NOTE — Discharge Instructions (Addendum)
Call Dr. Marella Bile office at Friday 9 AM  Thank you for choosing [place] for your health care today!  Please see your primary doctor this week for a follow up appointment.   Sometimes, in the early stages of certain disease courses it is difficult to detect in the emergency department evaluation -- so, it is important that you continue to monitor your symptoms and call your doctor right away or return to the emergency department if you develop any new or worsening symptoms.  Please go to the following website to schedule new (and existing) patient appointments:   [email or web address]  If you do not have a primary doctor try calling the following clinics to establish care:  If you have insurance:  [facility] [phone] [place] [address]   [facility]  [phone] [address] [place] [address]   If you do not have insurance:  [facility]  [phone] [address], [place] [address]   The following is another list of primary care offices in the area who are accepting new patients at this time.  Please reach out to one of them directly and let them know you would like to schedule an appointment to follow up on an Emergency Department visit, and/or to establish a new primary care provider (PCP).  There are likely other primary care clinics in the are who are accepting new patients, but this is an excellent place to start:  [place] physician: Dr Lavon Paganini [address] [place], [place] [address] [phone]  [facility] Lead Physician: Dr Steele Sizer [address], [place], [place] [address] [phone]  [place] Physician: Dr Park Liter [address] [place], [place] [address] [phone]  [facility] Lead Physician: Dr Dewaine Oats [place], [place], [place] [address] [phone]  [facility] Physician: Dr Halina Maidens [address] [place], [place] [address] [phone]   It was my pleasure to care for you today.   [place] Brunette Jacelyn Grip, MD

## 2022-04-11 NOTE — ED Notes (Signed)
Pt not in room T6. Will check to see if EDP Jacelyn Grip d/c pt himself.

## 2022-04-11 NOTE — ED Notes (Signed)
EDP S. Wong d/c pt himself.  

## 2022-04-11 NOTE — ED Provider Notes (Signed)
[facility] Provider Note    Event Date/Time   First MD Initiated Contact with Patient 04/11/22 0815     (approximate)   History   Chest Pain   HPI  CAMILLO QUADROS is a 42 y.o. male   Past medical history of motor, elevated BMI, presents emergency department with chest pain transient while at work.  Exertional.   No symptoms prior to last night.  2 AM while at work.  Chest tightness.  Has had nasal congestion URI symptoms that are resolving.  No drug or alcohol use.  No family history of MIs before 50.  History was obtained via patient and partner at bedside as independent historian.      Physical Exam   Triage Vital Signs: ED Triage Vitals  Enc Vitals Group     BP 04/11/22 0245 (!) 136/107     Pulse Rate 04/11/22 0245 (!) 109     Resp 04/11/22 0245 20     Temp 04/11/22 0245 98.1 F (36.7 C)     Temp Source 04/11/22 0245 Oral     SpO2 04/11/22 0245 96 %     Weight 04/11/22 0246 280 lb (127 kg)     Height 04/11/22 0246 5\' 10"  (1.778 m)     Head Circumference --      Peak Flow --      Pain Score 04/11/22 0245 8     Pain Loc --      Pain Edu? --      Excl. in GC? --     Most recent vital signs: Vitals:   04/11/22 0632 04/11/22 0756  BP: (!) 165/113 (!) 139/106  Pulse: 93 90  Resp: 20 16  Temp: 98.2 F (36.8 C) 97.7 F (36.5 C)  SpO2: 93% 95%    General: Awake, no distress.  CV:  Good peripheral perfusion.  Resp:  Normal effort.  Abd:  No distention.  Other:  Lungs clear without focality or wheezing, mildly hypertensive, otherwise hemodynamics appropriate reassuring.  Radial pulses intact and equal bilaterally.  No murmurs.  Skin appears warm well-perfused.  He appears comfortable   ED Results / Procedures / Treatments   Labs (all labs ordered are listed, but only abnormal results are displayed) Labs Reviewed  CBC WITH DIFFERENTIAL/PLATELET - Abnormal; Notable for the following components:      Result Value   RBC 5.90  (*)    All other components within normal limits  COMPREHENSIVE METABOLIC PANEL - Abnormal; Notable for the following components:   Glucose, Bld 159 (*)    Creatinine, Ser 1.78 (*)    Total Protein 8.3 (*)    AST 49 (*)    GFR, Estimated 49 (*)    All other components within normal limits  TROPONIN I (HIGH SENSITIVITY)  TROPONIN I (HIGH SENSITIVITY)     I reviewed labs and they are notable for prominent 17, 16 and 1.7 cr  EKG  ED ECG REPORT I, 06/10/22, the attending physician, personally viewed and interpreted this ECG.   Date: 04/11/2022  EKG Time: 0247  Rate: 112  Rhythm: sinus tachycardia  Axis: n  Intervals:none  ST&T Change: Inferolateral ST depressions    RADIOLOGY I independently reviewed and interpreted cxr and see no focality   PROCEDURES:  Critical Care performed: No  Procedures   MEDICATIONS ORDERED IN ED: Medications - No data to display  Consultants:  I spoke with 06/10/2022 cardiology regarding care plan for this patient.  IMPRESSION / MDM / ASSESSMENT AND PLAN / ED COURSE  I reviewed the triage vital signs and the nursing notes.                              Differential diagnosis includes, but is not limited to, ACS, angina, URI, pneumonia, dysrhythmia, PE or dissection    MDM: Patient with exertional chest pain with some cardiac risk factors ST depressions inferolateral leads flat troponins no longer with chest pain.  Moderate risk discussed with Dr. Yancey Flemings of cardiology for close outpatient follow-up scheduled for Friday.  I started the patient on low-dose aspirin.  Advised to stop smoking.  Advised to hydrate well due to a small bump in creatinine.  Return precautions given.   Patient's presentation is most consistent with acute presentation with potential threat to life or bodily function.       FINAL CLINICAL IMPRESSION(S) / ED DIAGNOSES   Final diagnoses:  Chest pain, unspecified type     Rx / DC Orders   ED Discharge  Orders          Ordered    aspirin EC 81 MG tablet  Daily        04/11/22 1016             Note:  This document was prepared using Dragon voice recognition software and may include unintentional dictation errors.    Lucillie Garfinkel, MD 04/11/22 1017

## 2022-05-15 ENCOUNTER — Institutional Professional Consult (permissible substitution): Payer: 59 | Admitting: Cardiovascular Disease

## 2022-05-19 ENCOUNTER — Ambulatory Visit: Payer: 59 | Admitting: Cardiovascular Disease

## 2022-05-19 ENCOUNTER — Encounter: Payer: Self-pay | Admitting: Cardiovascular Disease

## 2022-05-19 VITALS — BP 160/80 | HR 101 | Ht 70.0 in | Wt 307.2 lb

## 2022-05-19 DIAGNOSIS — I10 Essential (primary) hypertension: Secondary | ICD-10-CM

## 2022-05-19 DIAGNOSIS — E1122 Type 2 diabetes mellitus with diabetic chronic kidney disease: Secondary | ICD-10-CM | POA: Diagnosis not present

## 2022-05-19 DIAGNOSIS — R0789 Other chest pain: Secondary | ICD-10-CM

## 2022-05-19 DIAGNOSIS — E782 Mixed hyperlipidemia: Secondary | ICD-10-CM | POA: Diagnosis not present

## 2022-05-19 DIAGNOSIS — N182 Chronic kidney disease, stage 2 (mild): Secondary | ICD-10-CM

## 2022-05-19 NOTE — Progress Notes (Signed)
Cardiology Office Note   Date:  05/19/2022   ID:  Russell Ross, DOB May 14, 1980, MRN [id number]  PCP:  Pcp, No  Cardiologist:  Neoma Laming, MD      History of Present Illness: Russell Ross is a 42 y.o. male who presents for  Chief Complaint  Patient presents with   Follow-up    Cardiac consult  Chest Pain  This is a new problem. The current episode started more than 1 month ago. The onset quality is sudden. The problem occurs 2 to 4 times per day. The problem has been gradually improving. The pain is present in the substernal region. The pain is at a severity of 5/10. The quality of the pain is described as heavy and numbness. Associated symptoms include shortness of breath.        Past Medical History:  Diagnosis Date   Diabetes mellitus without complication ([place])    Hypertension      No past surgical history on file.   Current Outpatient Medications  Medication Sig Dispense Refill   aspirin EC 81 MG tablet Take 1 tablet (81 mg total) by mouth daily. Swallow whole. (Patient not taking: Reported on 05/19/2022) 150 tablet 2   hydrocortisone (ANUSOL-HC) 2.5 % rectal cream Place 1 application rectally 2 (two) times daily. (Patient not taking: Reported on 05/19/2022) 30 g 0   penicillin v potassium (VEETID) 500 MG tablet Take 1 tablet (500 mg total) by mouth 4 (four) times daily. (Patient not taking: Reported on 05/19/2022) 28 tablet 0   No current facility-administered medications for this visit.    Allergies:   Patient has no known allergies.    Social History:   reports that he has been smoking. He has never used smokeless tobacco. He reports current alcohol use. No history on file for drug use.   Family History:  family history is not on file.    ROS:     Review of Systems  Constitutional: Negative.   HENT: Negative.    Eyes: Negative.   Respiratory:  Positive for shortness of breath.   Cardiovascular:  Positive for chest pain.  Gastrointestinal:  Negative.   Genitourinary: Negative.   Musculoskeletal: Negative.   Skin: Negative.   Neurological: Negative.   Endo/Heme/Allergies: Negative.   Psychiatric/Behavioral: Negative.    All other systems reviewed and are negative.     All other systems are reviewed and negative.    PHYSICAL EXAM: VS:  BP (!) 160/80   Pulse (!) 101   Ht 5' 10"$  (1.778 m)   Wt (!) 307 lb 3.2 oz (139.3 kg)   SpO2 98%   BMI 44.08 kg/m  , BMI Body mass index is 44.08 kg/m. Last weight:  Wt Readings from Last 3 Encounters:  05/19/22 (!) 307 lb 3.2 oz (139.3 kg)  04/11/22 280 lb (127 kg)  02/20/20 (!) 301 lb (136.5 kg)     Physical Exam Vitals reviewed.  Constitutional:      Appearance: Normal appearance. He is normal weight.  HENT:     Head: Normocephalic.     Nose: Nose normal.     Mouth/Throat:     Mouth: Mucous membranes are moist.  Eyes:     Pupils: Pupils are equal, round, and reactive to light.  Cardiovascular:     Rate and Rhythm: Normal rate and regular rhythm.     Pulses: Normal pulses.     Heart sounds: Normal heart sounds.  Pulmonary:     Effort:  Pulmonary effort is normal.  Abdominal:     General: Abdomen is flat. Bowel sounds are normal.  Musculoskeletal:        General: Normal range of motion.     Cervical back: Normal range of motion.  Skin:    General: Skin is warm.  Neurological:     General: No focal deficit present.     Mental Status: He is alert.  Psychiatric:        Mood and Affect: Mood normal.       EKG: 04/11/22 si [place] tachycardia prwp lvh  Recent Labs: 04/11/2022: ALT 37; BUN 17; Creatinine, Ser 1.78; Hemoglobin 16.8; Platelets 286; Potassium 4.0; Sodium 138    Lipid Panel No results found for: "CHOL", "TRIG", "HDL", "CHOLHDL", "VLDL", "LDLCALC", "LDLDIRECT"    Other studies Reviewed: Additional studies/ records that were reviewed today include:  Review of the above records demonstrates:       No data to display            ASSESSMENT AND  PLAN:    ICD-10-CM   1. Other chest pain  R07.89    Went to ER with chest pain,sent for evaluation, will do echo and stress test as ekg abnormal and creat 1.78    2. Mixed hyperlipidemia  E78.2     3. Controlled type 2 diabetes mellitus with stage 2 chronic kidney disease, without long-term current use of insulin (HCC)  E11.22    N18.2     4. Primary hypertension  I10        Problem List Items Addressed This Visit   None Visit Diagnoses     Other chest pain    -  Primary   Went to ER with chest pain,sent for evaluation, will do echo and stress test as ekg abnormal and creat 1.78   Mixed hyperlipidemia       Controlled type 2 diabetes mellitus with stage 2 chronic kidney disease, without long-term current use of insulin ([place])       Primary hypertension              Disposition:   No follow-ups on file.    Total time spent  Signed,  Neoma Laming, MD  05/19/2022 [place]

## 2022-05-23 ENCOUNTER — Ambulatory Visit: Payer: 59 | Admitting: Internal Medicine

## 2022-05-23 ENCOUNTER — Encounter: Payer: Self-pay | Admitting: Internal Medicine

## 2022-05-23 VITALS — BP 170/110 | HR 98 | Ht 70.0 in | Wt 308.8 lb

## 2022-05-23 DIAGNOSIS — N182 Chronic kidney disease, stage 2 (mild): Secondary | ICD-10-CM

## 2022-05-23 DIAGNOSIS — E782 Mixed hyperlipidemia: Secondary | ICD-10-CM

## 2022-05-23 DIAGNOSIS — E1122 Type 2 diabetes mellitus with diabetic chronic kidney disease: Secondary | ICD-10-CM

## 2022-05-23 DIAGNOSIS — I10 Essential (primary) hypertension: Secondary | ICD-10-CM

## 2022-05-23 DIAGNOSIS — N1831 Chronic kidney disease, stage 3a: Secondary | ICD-10-CM | POA: Diagnosis not present

## 2022-05-23 MED ORDER — AMLODIPINE-OLMESARTAN 5-20 MG PO TABS: 1.0000 | ORAL_TABLET | Freq: Every day | ORAL | 0 refills | Status: DC

## 2022-05-23 NOTE — Progress Notes (Signed)
Established Patient Office Visit  Subjective:  Patient ID: Russell Ross, male    DOB: [birth year]  Age: 42 y.o. MRN: [id number]  Chief Complaint  Patient presents with   [place] patient    Here to establish IM care after lost to follow up at [facility]. PMH of HTN and Diabetes mellitus diet controlled. No CP, headaches or dizziness. C/o bilateral solar foot pain but spends his entire working day on his feet.   Past Medical History:  Diagnosis Date   Diabetes mellitus without complication ([place])    Hypertension     Social History   Socioeconomic History   Marital status: Single    Spouse name: Not on file   Number of children: Not on file   Years of education: Not on file   Highest education level: Not on file  Occupational History   Not on file  Tobacco Use   Smoking status: Every Day   Smokeless tobacco: Never  Substance and Sexual Activity   Alcohol use: Yes   Drug use: Not on file   Sexual activity: Not on file  Other Topics Concern   Not on file  Social History Narrative   Not on file   Social Determinants of Health   Financial Resource Strain: Not on file  Food Insecurity: Not on file  Transportation Needs: Not on file  Physical Activity: Not on file  Stress: Not on file  Social Connections: Not on file  Intimate Partner Violence: Not on file    No family history on file.  No Known Allergies  Review of Systems  Constitutional: Negative.  Negative for malaise/fatigue and weight loss.  HENT: Negative.    Eyes: Negative.   Respiratory: Negative.    Cardiovascular: Negative.   Gastrointestinal: Negative.  Negative for blood in stool, constipation and heartburn.  Genitourinary: Negative.   Musculoskeletal: Negative.   Skin: Negative.   Neurological: Negative.   Endo/Heme/Allergies: Negative.   Psychiatric/Behavioral: Negative.         Objective:   BP (!) 170/110   Pulse 98   Ht 5' 10"$  (1.778 m)   Wt (!) 308 lb 12.8 oz  (140.1 kg)   SpO2 98%   BMI 44.31 kg/m   Vitals:   05/23/22 1352  BP: (!) 170/110  Pulse: 98  Height: 5' 10"$  (1.778 m)  Weight: (!) 308 lb 12.8 oz (140.1 kg)  SpO2: 98%  BMI (Calculated): 44.31    Physical Exam Vitals reviewed.  Constitutional:      Appearance: Normal appearance. He is obese.  HENT:     Head: Normocephalic.     Left Ear: There is no impacted cerumen.     Nose: Nose normal.     Mouth/Throat:     Mouth: Mucous membranes are moist.     Pharynx: No posterior oropharyngeal erythema.  Eyes:     Extraocular Movements: Extraocular movements intact.     Pupils: Pupils are equal, round, and reactive to light.  Cardiovascular:     Rate and Rhythm: Regular rhythm.     Chest Wall: PMI is not displaced.     Pulses: Normal pulses.     Heart sounds: Normal heart sounds. No murmur heard. Pulmonary:     Effort: Pulmonary effort is normal.     Breath sounds: Normal air entry. No rhonchi or rales.  Abdominal:     General: Abdomen is flat. Bowel sounds are normal. There is no distension.  Palpations: Abdomen is soft. There is no hepatomegaly, splenomegaly or mass.     Tenderness: There is no abdominal tenderness.  Musculoskeletal:        General: Normal range of motion.     Cervical back: Normal range of motion and neck supple.     Right lower leg: No edema.     Left lower leg: No edema.  Skin:    General: Skin is warm and dry.  Neurological:     General: No focal deficit present.     Mental Status: He is alert and oriented to person, place, and time.     Cranial Nerves: No cranial nerve deficit.     Motor: No weakness.  Psychiatric:        Mood and Affect: Mood normal.        Behavior: Behavior normal.      No results found for any visits on 05/23/22.  Recent Results (from the past 2160 hour(Alphonsus Doyel))  CBC with Differential     Status: Abnormal   Collection Time: 04/11/22  2:48 AM  Result Value Ref Range   WBC 6.4 4.0 - 10.5 K/uL   RBC 5.90 (H) 4.22 - 5.81  MIL/uL   Hemoglobin 16.8 13.0 - 17.0 g/dL   HCT 48.2 39.0 - 52.0 %   MCV 81.7 80.0 - 100.0 fL   MCH 28.5 26.0 - 34.0 pg   MCHC 34.9 30.0 - 36.0 g/dL   RDW 13.9 11.5 - 15.5 %   Platelets 286 150 - 400 K/uL   nRBC 0.0 0.0 - 0.2 %   Neutrophils Relative % 61 %   Neutro Abs 3.8 1.7 - 7.7 K/uL   Lymphocytes Relative 30 %   Lymphs Abs 1.9 0.7 - 4.0 K/uL   Monocytes Relative 7 %   Monocytes Absolute 0.4 0.1 - 1.0 K/uL   Eosinophils Relative 1 %   Eosinophils Absolute 0.1 0.0 - 0.5 K/uL   Basophils Relative 1 %   Basophils Absolute 0.1 0.0 - 0.1 K/uL   Immature Granulocytes 0 %   Abs Immature Granulocytes 0.02 0.00 - 0.07 K/uL    Comment: Performed at [facility], [address] [place], [place] [address]  Comprehensive metabolic panel     Status: Abnormal   Collection Time: 04/11/22  2:48 AM  Result Value Ref Range   Sodium 138 135 - 145 mmol/L   Potassium 4.0 3.5 - 5.1 mmol/L   Chloride 103 98 - 111 mmol/L   CO2 26 22 - 32 mmol/L   Glucose, Bld 159 (H) 70 - 99 mg/dL    Comment: Glucose reference range applies only to samples taken after fasting for at least 8 hours.   BUN 17 6 - 20 mg/dL   Creatinine, Ser 1.78 (H) 0.61 - 1.24 mg/dL   Calcium 9.0 8.9 - 10.3 mg/dL   Total Protein 8.3 (H) 6.5 - 8.1 g/dL   Albumin 4.5 3.5 - 5.0 g/dL   AST 49 (H) 15 - 41 U/L   ALT 37 0 - 44 U/L   Alkaline Phosphatase 61 38 - 126 U/L   Total Bilirubin 0.8 0.3 - 1.2 mg/dL   GFR, Estimated 49 (L) >60 mL/min    Comment: (NOTE) Calculated using the CKD-EPI Creatinine Equation (2021)    Anion gap 9 5 - 15    Comment: Performed at [facility], [address] [place], [place] [address]  Troponin I (High Sensitivity)     Status: None  Collection Time: 04/11/22  2:48 AM  Result Value Ref Range   Troponin I (High Sensitivity) 17 <18 ng/L    Comment: (NOTE) Elevated high sensitivity troponin I (hsTnI) values and significant  changes across serial measurements may suggest ACS but  many other  chronic and acute conditions are known to elevate hsTnI results.  Refer to the "Links" section for chest pain algorithms and additional  guidance. Performed at [facility], [place], [place] [address]   Troponin I (High Sensitivity)     Status: None   Collection Time: 04/11/22  6:34 AM  Result Value Ref Range   Troponin I (High Sensitivity) 16 <18 ng/L    Comment: (NOTE) Elevated high sensitivity troponin I (hsTnI) values and significant  changes across serial measurements may suggest ACS but many other  chronic and acute conditions are known to elevate hsTnI results.  Refer to the "Links" section for chest pain algorithms and additional  guidance. Performed at [facility], [address] [place], [place] [address]       Assessment & Plan:   Problem List Items Addressed This Visit   None 1. Mixed hyperlipidemia - Lipid panel  2. Controlled type 2 diabetes mellitus with stage 2 chronic kidney disease, without long-term current use of insulin (HCC) - Hemoglobin A1c  3. Primary hypertension - amLODipine-olmesartan (AZOR) 5-20 MG tablet; Take 1 tablet by mouth daily.  Dispense: 30 tablet; Refill: 0 - CBC With Diff/Platelet  4. Stage 3a chronic kidney disease (HCC) - Comprehensive metabolic panel - US Renal; Future  5. Morbid obesity ([place]) - Home sleep test    No follow-ups on file.   Total time spent: 35 minutes  Volanda Napoleon, MD  05/23/2022

## 2022-05-25 ENCOUNTER — Ambulatory Visit (INDEPENDENT_AMBULATORY_CARE_PROVIDER_SITE_OTHER): Payer: 59

## 2022-05-25 DIAGNOSIS — E782 Mixed hyperlipidemia: Secondary | ICD-10-CM

## 2022-05-25 DIAGNOSIS — R0789 Other chest pain: Secondary | ICD-10-CM

## 2022-05-25 DIAGNOSIS — E1122 Type 2 diabetes mellitus with diabetic chronic kidney disease: Secondary | ICD-10-CM

## 2022-05-25 DIAGNOSIS — N182 Chronic kidney disease, stage 2 (mild): Secondary | ICD-10-CM

## 2022-05-25 DIAGNOSIS — I10 Essential (primary) hypertension: Secondary | ICD-10-CM

## 2022-05-25 MED ORDER — TECHNETIUM TC 99M SESTAMIBI GENERIC - CARDIOLITE
12.6000 | Freq: Once | INTRAVENOUS | Status: AC | PRN
  Administered 2022-05-25: 12.6 via INTRAVENOUS

## 2022-05-25 MED ORDER — TECHNETIUM TC 99M SESTAMIBI GENERIC - CARDIOLITE
31.3000 | Freq: Once | INTRAVENOUS | Status: AC | PRN
  Administered 2022-05-25: 31.3 via INTRAVENOUS

## 2022-05-26 ENCOUNTER — Ambulatory Visit (INDEPENDENT_AMBULATORY_CARE_PROVIDER_SITE_OTHER): Payer: 59 | Admitting: Cardiovascular Disease

## 2022-05-26 ENCOUNTER — Encounter: Payer: Self-pay | Admitting: Internal Medicine

## 2022-05-26 DIAGNOSIS — I351 Nonrheumatic aortic (valve) insufficiency: Secondary | ICD-10-CM | POA: Diagnosis not present

## 2022-05-26 DIAGNOSIS — R0789 Other chest pain: Secondary | ICD-10-CM

## 2022-05-26 DIAGNOSIS — I10 Essential (primary) hypertension: Secondary | ICD-10-CM

## 2022-05-26 DIAGNOSIS — E1122 Type 2 diabetes mellitus with diabetic chronic kidney disease: Secondary | ICD-10-CM

## 2022-05-26 DIAGNOSIS — E782 Mixed hyperlipidemia: Secondary | ICD-10-CM

## 2022-05-31 ENCOUNTER — Ambulatory Visit (INDEPENDENT_AMBULATORY_CARE_PROVIDER_SITE_OTHER): Payer: 59

## 2022-05-31 DIAGNOSIS — N1831 Chronic kidney disease, stage 3a: Secondary | ICD-10-CM | POA: Diagnosis not present

## 2022-06-06 ENCOUNTER — Encounter: Payer: Self-pay | Admitting: Cardiovascular Disease

## 2022-06-06 ENCOUNTER — Ambulatory Visit (INDEPENDENT_AMBULATORY_CARE_PROVIDER_SITE_OTHER): Payer: 59 | Admitting: Cardiovascular Disease

## 2022-06-06 VITALS — BP 142/100 | HR 95 | Ht 70.0 in | Wt 301.4 lb

## 2022-06-06 DIAGNOSIS — E782 Mixed hyperlipidemia: Secondary | ICD-10-CM | POA: Diagnosis not present

## 2022-06-06 DIAGNOSIS — I10 Essential (primary) hypertension: Secondary | ICD-10-CM

## 2022-06-06 DIAGNOSIS — R9439 Abnormal result of other cardiovascular function study: Secondary | ICD-10-CM

## 2022-06-06 DIAGNOSIS — I493 Ventricular premature depolarization: Secondary | ICD-10-CM

## 2022-06-06 DIAGNOSIS — N1831 Chronic kidney disease, stage 3a: Secondary | ICD-10-CM | POA: Diagnosis not present

## 2022-06-06 MED ORDER — HYDRALAZINE HCL 25 MG PO TABS: 25.0000 mg | ORAL_TABLET | Freq: Two times a day (BID) | ORAL | 2 refills | Status: AC

## 2022-06-06 MED ORDER — ASPIRIN 81 MG PO TBEC: 81.0000 mg | DELAYED_RELEASE_TABLET | Freq: Every day | ORAL | 12 refills | Status: AC

## 2022-06-06 MED ORDER — METOPROLOL SUCCINATE ER 50 MG PO TB24: 50.0000 mg | ORAL_TABLET | Freq: Every day | ORAL | 11 refills | Status: AC

## 2022-06-06 MED ORDER — ROSUVASTATIN CALCIUM 10 MG PO TABS: 10.0000 mg | ORAL_TABLET | Freq: Every day | ORAL | 6 refills | Status: AC

## 2022-06-06 NOTE — Progress Notes (Signed)
Closing encounter for provider.

## 2022-06-06 NOTE — Progress Notes (Signed)
Cardiology Office Note   Date:  06/06/2022   ID:  Russell Ross, DOB July 13, 1980, MRN [id number]  PCP:  Pcp, No  Cardiologist:  Neoma Laming, MD      History of Present Illness: Russell Ross is a 42 y.o. male who presents for  Chief Complaint  Patient presents with   Follow-up    4 week NST/ECHO Results    Patient in office to discuss results of echo and stress test. Denies chest pain, shortness of breath, palpitations.      Past Medical History:  Diagnosis Date   Diabetes mellitus without complication ([place])    Hyperlipidemia    Hypertension      History reviewed. No pertinent surgical history.   Current Outpatient Medications  Medication Sig Dispense Refill   amLODipine-olmesartan (AZOR) 5-20 MG tablet Take 1 tablet by mouth daily. 30 tablet 0   aspirin EC 81 MG tablet Take 1 tablet (81 mg total) by mouth daily. Swallow whole. 30 tablet 12   hydrALAZINE (APRESOLINE) 25 MG tablet Take 1 tablet (25 mg total) by mouth 2 (two) times daily. 60 tablet 2   metoprolol succinate (TOPROL XL) 50 MG 24 hr tablet Take 1 tablet (50 mg total) by mouth daily. Take with or immediately following a meal. 30 tablet 11   rosuvastatin (CRESTOR) 10 MG tablet Take 1 tablet (10 mg total) by mouth daily. 30 tablet 6   No current facility-administered medications for this visit.    Allergies:   Patient has no known allergies.    Social History:   reports that he has been smoking cigarettes. He has a 12.50 pack-year smoking history. He has never used smokeless tobacco. He reports current alcohol use of about 1.0 standard drink of alcohol per week. No history on file for drug use.   Family History:  family history is not on file.    ROS:     Review of Systems  Constitutional: Negative.   HENT: Negative.    Eyes: Negative.   Respiratory: Negative.    Gastrointestinal: Negative.   Genitourinary: Negative.   Musculoskeletal: Negative.   Skin: Negative.   Neurological: Negative.    Endo/Heme/Allergies: Negative.   Psychiatric/Behavioral: Negative.    All other systems reviewed and are negative.   All other systems are reviewed and negative.   PHYSICAL EXAM: VS:  BP (!) 142/100   Pulse 95   Ht '5\' 10"'$  (1.778 m)   Wt (!) 301 lb 6.4 oz (136.7 kg)   SpO2 97%   BMI 43.25 kg/m  , BMI Body mass index is 43.25 kg/m. Last weight:  Wt Readings from Last 3 Encounters:  06/06/22 (!) 301 lb 6.4 oz (136.7 kg)  05/23/22 (!) 308 lb 12.8 oz (140.1 kg)  05/19/22 (!) 307 lb 3.2 oz (139.3 kg)    Physical Exam Vitals reviewed.  Constitutional:      Appearance: Normal appearance. He is normal weight.  HENT:     Head: Normocephalic.     Nose: Nose normal.     Mouth/Throat:     Mouth: Mucous membranes are moist.  Eyes:     Pupils: Pupils are equal, round, and reactive to light.  Cardiovascular:     Rate and Rhythm: Normal rate and regular rhythm.     Pulses: Normal pulses.     Heart sounds: Normal heart sounds.  Pulmonary:     Effort: Pulmonary effort is normal.  Abdominal:     General: Abdomen is flat.  Bowel sounds are normal.  Musculoskeletal:        General: Normal range of motion.     Cervical back: Normal range of motion.  Skin:    General: Skin is warm.  Neurological:     General: No focal deficit present.     Mental Status: He is alert.  Psychiatric:        Mood and Affect: Mood normal.     EKG: none today  Recent Labs: 04/11/2022: ALT 37; BUN 17; Creatinine, Ser 1.78; Hemoglobin 16.8; Platelets 286; Potassium 4.0; Sodium 138    Lipid Panel No results found for: "CHOL", "TRIG", "HDL", "CHOLHDL", "VLDL", "LDLCALC", "LDLDIRECT"    Other studies Reviewed: echo and stress test   ASSESSMENT AND PLAN:    ICD-10-CM   1. Primary hypertension  99991111 Basic metabolic panel    hydrALAZINE (APRESOLINE) 25 MG tablet    metoprolol succinate (TOPROL XL) 50 MG 24 hr tablet    2. Morbid obesity ([place])  E66.01     3. Mixed hyperlipidemia  E78.2 rosuvastatin  (CRESTOR) 10 MG tablet    4. Stage 3a chronic kidney disease (HCC)  N18.31     5. Abnormal cardiovascular stress test  R94.39 CT CORONARY MORPH W/CTA COR W/SCORE W/CA W/CM &/OR WO/CM    aspirin EC 81 MG tablet    rosuvastatin (CRESTOR) 10 MG tablet    6. PVC (premature ventricular contraction)  I49.3 metoprolol succinate (TOPROL XL) 50 MG 24 hr tablet       Problem List Items Addressed This Visit       Cardiovascular and Mediastinum   Primary hypertension - Primary    Patient has uncontrolled hypertension. Echo revealed normal EF, severe LVH. Stress test positive for reversible perfusion defect. Kidney function decreased during recent hospitalization. Will recheck kidney function to determine if patient can proceed with CCTA. Hydralazine added to better control b/p. Metoprolol added for PVCs, increased heart rate. Add aspirin and rosuvastatin due to abnormal stress test.       Relevant Medications   hydrALAZINE (APRESOLINE) 25 MG tablet   metoprolol succinate (TOPROL XL) 50 MG 24 hr tablet   aspirin EC 81 MG tablet   rosuvastatin (CRESTOR) 10 MG tablet   Other Relevant Orders   Basic metabolic panel     Genitourinary   Stage 3a chronic kidney disease (HCC)     Other   Morbid obesity (HCC)   Mixed hyperlipidemia   Relevant Medications   hydrALAZINE (APRESOLINE) 25 MG tablet   metoprolol succinate (TOPROL XL) 50 MG 24 hr tablet   aspirin EC 81 MG tablet   rosuvastatin (CRESTOR) 10 MG tablet   Other Visit Diagnoses     Abnormal cardiovascular stress test       Relevant Medications   aspirin EC 81 MG tablet   rosuvastatin (CRESTOR) 10 MG tablet   Other Relevant Orders   CT CORONARY MORPH W/CTA COR W/SCORE W/CA W/CM &/OR WO/CM   PVC (premature ventricular contraction)       Relevant Medications   hydrALAZINE (APRESOLINE) 25 MG tablet   metoprolol succinate (TOPROL XL) 50 MG 24 hr tablet   aspirin EC 81 MG tablet   rosuvastatin (CRESTOR) 10 MG tablet         Disposition:   Return in about 2 weeks (around 06/20/2022) for CCTA if kidney function is normal.    Total time spent: 30 minutes  Signed,  Neoma Laming, MD  06/06/2022 2:04 [facility]

## 2022-06-06 NOTE — Progress Notes (Deleted)
    Cardiology Office Note   Date:  06/06/2022   ID:  Russell Ross, DOB Sep 28, 1980, MRN [id number]  PCP:  Pcp, No  Cardiologist:  Neoma Laming, MD      History of Present Illness: Russell Ross is a 42 y.o. male who presents for  Chief Complaint  Patient presents with   Follow-up    4 week NST/ECHO Results    HPI    Past Medical History:  Diagnosis Date   Diabetes mellitus without complication ([place])    Hyperlipidemia    Hypertension      No past surgical history on file.   Current Outpatient Medications  Medication Sig Dispense Refill   amLODipine-olmesartan (AZOR) 5-20 MG tablet Take 1 tablet by mouth daily. 30 tablet 0   No current facility-administered medications for this visit.    Allergies:   Patient has no known allergies.    Social History:   reports that he has been smoking cigarettes. He has a 12.50 pack-year smoking history. He has never used smokeless tobacco. He reports current alcohol use of about 1.0 standard drink of alcohol per week. No history on file for drug use.   Family History:  family history is not on file.    ROS:     ROS    All other systems are reviewed and negative.    PHYSICAL EXAM: VS:  BP (!) 142/100   Pulse 95   Ht '5\' 10"'$  (1.778 m)   Wt (!) 301 lb 6.4 oz (136.7 kg)   SpO2 97%   BMI 43.25 kg/m  , BMI Body mass index is 43.25 kg/m. Last weight:  Wt Readings from Last 3 Encounters:  06/06/22 (!) 301 lb 6.4 oz (136.7 kg)  05/23/22 (!) 308 lb 12.8 oz (140.1 kg)  05/19/22 (!) 307 lb 3.2 oz (139.3 kg)     Physical Exam    EKG:   Recent Labs: 04/11/2022: ALT 37; BUN 17; Creatinine, Ser 1.78; Hemoglobin 16.8; Platelets 286; Potassium 4.0; Sodium 138    Lipid Panel No results found for: "CHOL", "TRIG", "HDL", "CHOLHDL", "VLDL", "LDLCALC", "LDLDIRECT"    Other studies Reviewed: Additional studies/ records that were reviewed today include:  Review of the above records demonstrates:       No data to display             ASSESSMENT AND PLAN:    ICD-10-CM   1. Primary hypertension  I10     2. Morbid obesity ([place])  E66.01     3. Mixed hyperlipidemia  E78.2     4. Stage 3a chronic kidney disease (HCC)  N18.31        Problem List Items Addressed This Visit       Cardiovascular and Mediastinum   Primary hypertension - Primary     Genitourinary   Stage 3a chronic kidney disease ([place])     Other   Morbid obesity ([place])   Mixed hyperlipidemia       Disposition:   No follow-ups on file.    Total time spent: {AMA time spent:29001} minutes  Signed,  Neoma Laming, MD  06/06/2022 1:30 PM    [place]

## 2022-06-06 NOTE — Assessment & Plan Note (Signed)
Patient has uncontrolled hypertension. Echo revealed normal EF, severe LVH. Stress test positive for reversible perfusion defect. Kidney function decreased during recent hospitalization. Will recheck kidney function to determine if patient can proceed with CCTA. Hydralazine added to better control b/p. Metoprolol added for PVCs, increased heart rate. Add aspirin and rosuvastatin due to abnormal stress test.

## 2022-06-06 NOTE — Patient Instructions (Signed)
Add hydralazine in addition to current medications for blood pressure Add metoprolol for elevated heart rate, PVCs Add aspirin and rosuvastatin for prevention of heart disease.

## 2022-06-07 ENCOUNTER — Other Ambulatory Visit: Payer: 59

## 2022-06-08 LAB — COMPREHENSIVE METABOLIC PANEL
ALT: 31 IU/L (ref 0–44)
AST: 29 IU/L (ref 0–40)
Albumin/Globulin Ratio: 1.6 (ref 1.2–2.2)
Albumin: 4.5 g/dL (ref 4.1–5.1)
Alkaline Phosphatase: 71 IU/L (ref 44–121)
BUN/Creatinine Ratio: 11 (ref 9–20)
BUN: 11 mg/dL (ref 6–24)
Bilirubin Total: 0.6 mg/dL (ref 0.0–1.2)
CO2: 22 mmol/L (ref 20–29)
Calcium: 8.8 mg/dL (ref 8.7–10.2)
Chloride: 104 mmol/L (ref 96–106)
Creatinine, Ser: 1.01 mg/dL (ref 0.76–1.27)
Globulin, Total: 2.8 g/dL (ref 1.5–4.5)
Glucose: 110 mg/dL — ABNORMAL HIGH (ref 70–99)
Potassium: 4.5 mmol/L (ref 3.5–5.2)
Sodium: 141 mmol/L (ref 134–144)
Total Protein: 7.3 g/dL (ref 6.0–8.5)
eGFR: 96 mL/min/{1.73_m2} (ref >59)

## 2022-06-08 LAB — CBC WITH DIFF/PLATELET
Basophils Absolute: 0 10*3/uL (ref 0.0–0.2)
Basos: 1 %
EOS (ABSOLUTE): 0.2 10*3/uL (ref 0.0–0.4)
Eos: 3 %
Hematocrit: 43.7 % (ref 37.5–51.0)
Hemoglobin: 14.9 g/dL (ref 13.0–17.7)
Immature Grans (Abs): 0 10*3/uL (ref 0.0–0.1)
Immature Granulocytes: 0 %
Lymphocytes Absolute: 2.4 10*3/uL (ref 0.7–3.1)
Lymphs: 40 %
MCH: 28.4 pg (ref 26.6–33.0)
MCHC: 34.1 g/dL (ref 31.5–35.7)
MCV: 83 fL (ref 79–97)
Monocytes Absolute: 0.4 10*3/uL (ref 0.1–0.9)
Monocytes: 6 %
Neutrophils Absolute: 3 10*3/uL (ref 1.4–7.0)
Neutrophils: 50 %
Platelets: 344 10*3/uL (ref 150–450)
RBC: 5.24 x10E6/uL (ref 4.14–5.80)
RDW: 13.9 % (ref 11.6–15.4)
WBC: 6 10*3/uL (ref 3.4–10.8)

## 2022-06-08 LAB — HEMOGLOBIN A1C
Est. average glucose Bld gHb Est-mCnc: 169 mg/dL
Hgb A1c MFr Bld: 7.5 % — ABNORMAL HIGH (ref 4.8–5.6)

## 2022-06-08 LAB — LIPID PANEL
Chol/HDL Ratio: 5.8 ratio — ABNORMAL HIGH (ref 0.0–5.0)
Cholesterol, Total: 156 mg/dL (ref 100–199)
HDL: 27 mg/dL — ABNORMAL LOW (ref >39)
LDL Chol Calc (NIH): 95 mg/dL (ref 0–99)
Triglycerides: 198 mg/dL — ABNORMAL HIGH (ref 0–149)
VLDL Cholesterol Cal: 34 mg/dL (ref 5–40)

## 2022-06-13 ENCOUNTER — Ambulatory Visit: Payer: 59 | Admitting: Internal Medicine

## 2022-06-14 ENCOUNTER — Encounter: Payer: Self-pay | Admitting: Emergency Medicine

## 2022-06-14 ENCOUNTER — Other Ambulatory Visit: Payer: Self-pay

## 2022-06-14 ENCOUNTER — Emergency Department: Payer: 59

## 2022-06-14 ENCOUNTER — Other Ambulatory Visit: Payer: Self-pay | Admitting: Internal Medicine

## 2022-06-14 ENCOUNTER — Emergency Department
Admission: EM | Admit: 2022-06-14 | Discharge: 2022-06-14 | Disposition: A | Discharge status: Home / Self Care | Payer: 59 | Attending: Emergency Medicine | Admitting: Emergency Medicine

## 2022-06-14 DIAGNOSIS — N1831 Chronic kidney disease, stage 3a: Secondary | ICD-10-CM | POA: Diagnosis not present

## 2022-06-14 DIAGNOSIS — I129 Hypertensive chronic kidney disease with stage 1 through stage 4 chronic kidney disease, or unspecified chronic kidney disease: Secondary | ICD-10-CM | POA: Insufficient documentation

## 2022-06-14 DIAGNOSIS — I10 Essential (primary) hypertension: Secondary | ICD-10-CM

## 2022-06-14 DIAGNOSIS — R079 Chest pain, unspecified: Secondary | ICD-10-CM | POA: Diagnosis not present

## 2022-06-14 DIAGNOSIS — E1122 Type 2 diabetes mellitus with diabetic chronic kidney disease: Secondary | ICD-10-CM | POA: Diagnosis not present

## 2022-06-14 LAB — CBC
HCT: 41.3 % (ref 39.0–52.0)
Hemoglobin: 14.7 g/dL (ref 13.0–17.0)
MCH: 29.2 pg (ref 26.0–34.0)
MCHC: 35.6 g/dL (ref 30.0–36.0)
MCV: 82.1 fL (ref 80.0–100.0)
Platelets: 340 10*3/uL (ref 150–400)
RBC: 5.03 MIL/uL (ref 4.22–5.81)
RDW: 13.7 % (ref 11.5–15.5)
WBC: 6.9 10*3/uL (ref 4.0–10.5)
nRBC: 0 % (ref 0.0–0.2)

## 2022-06-14 LAB — BASIC METABOLIC PANEL
Anion gap: 8 (ref 5–15)
BUN: 19 mg/dL (ref 6–20)
CO2: 23 mmol/L (ref 22–32)
Calcium: 8.5 mg/dL — ABNORMAL LOW (ref 8.9–10.3)
Chloride: 104 mmol/L (ref 98–111)
Creatinine, Ser: 1.15 mg/dL (ref 0.61–1.24)
GFR, Estimated: >60 mL/min (ref >60)
Glucose, Bld: 160 mg/dL — ABNORMAL HIGH (ref 70–99)
Potassium: 3.8 mmol/L (ref 3.5–5.1)
Sodium: 135 mmol/L (ref 135–145)

## 2022-06-14 LAB — BRAIN NATRIURETIC PEPTIDE: B Natriuretic Peptide: 42.1 pg/mL (ref 0.0–100.0)

## 2022-06-14 LAB — TROPONIN I (HIGH SENSITIVITY)
Troponin I (High Sensitivity): 10 ng/L (ref <18)
Troponin I (High Sensitivity): 9 ng/L (ref <18)

## 2022-06-14 MED ORDER — ASPIRIN 81 MG PO CHEW
324.0000 mg | CHEWABLE_TABLET | Freq: Once | ORAL | Status: AC
  Administered 2022-06-14: 324 mg via ORAL
  Filled 2022-06-14: qty 4

## 2022-06-14 NOTE — ED Provider Notes (Signed)
[facility] Provider Note    Event Date/Time   First MD Initiated Contact with Patient 06/14/22 0302     (approximate)   History   Chest Pain   HPI  Russell Ross is a 42 y.o. male past medical history of hypertension hyperlipidemia and diabetes who presents because of chest pain.  Patient was at work sitting down when he had sharp left-sided chest pain that lasted for about 10 seconds.  It did not radiate he had no associated nausea diaphoresis or shortness of breath.  This occurred briefly when he got to the ER but he has not had any pain since.  He was seen in the ED 2 months ago for similar episode of transient chest pain at that point was found to have an abnormal EKG and was referred to cardiology as his troponins were negative in the ER and he had no ongoing pain.  He had echo and stress test ordered by Dr. Humphrey Rolls.  I am not able to fully read the report of the stress test but apparently it was abnormal and he is scheduled to have a coronary CTA.  He has not had any episodes of chest pain in between his ED visits     Past Medical History:  Diagnosis Date   Diabetes mellitus without complication ([place])    Hyperlipidemia    Hypertension     Patient Active Problem List   Diagnosis Date Noted   Morbid obesity ([place]) 05/23/2022   Stage 3a chronic kidney disease ([place]) 05/23/2022   Primary hypertension 05/23/2022   Mixed hyperlipidemia 05/23/2022     Physical Exam  Triage Vital Signs: ED Triage Vitals  Enc Vitals Group     BP 06/14/22 0246 (!) 150/90     Pulse Rate 06/14/22 0246 70     Resp 06/14/22 0246 (!) 22     Temp 06/14/22 0246 98.1 F (36.7 C)     Temp Source 06/14/22 0246 Oral     SpO2 06/14/22 0246 97 %     Weight 06/14/22 0241 (!) 305 lb (138.3 kg)     Height 06/14/22 0241 '5\' 10"'$  (1.778 m)     Head Circumference --      Peak Flow --      Pain Score 06/14/22 0241 4     Pain Loc --      Pain Edu? --      Excl. in [place]? --     Most  recent vital signs: Vitals:   06/14/22 0246 06/14/22 0400  BP: (!) 150/90 125/67  Pulse: 70 73  Resp: (!) 22 (!) 24  Temp: 98.1 F (36.7 C)   SpO2: 97% 96%     General: Awake, no distress.  CV:  Good peripheral perfusion.  No peripheral edema Resp:  Normal effort.  Lung sounds are clear Abd:  No distention.  Neuro:             Awake, Alert, Oriented x 3  Other:     ED Results / Procedures / Treatments  Labs (all labs ordered are listed, but only abnormal results are displayed) Labs Reviewed  BASIC METABOLIC PANEL - Abnormal; Notable for the following components:      Result Value   Glucose, Bld 160 (*)    Calcium 8.5 (*)    All other components within normal limits  CBC  BRAIN NATRIURETIC PEPTIDE  TROPONIN I (HIGH SENSITIVITY)  TROPONIN I (HIGH SENSITIVITY)     EKG  EKG reviewed and interpreted by myself shows sinus rhythm with normal axis normal intervals deep T wave inversions inferiorly and in lead I, V4, V5, V6   RADIOLOGY   PROCEDURES:  Critical Care performed: No  Procedures  The patient is on the cardiac monitor to evaluate for evidence of arrhythmia and/or significant heart rate changes.   MEDICATIONS ORDERED IN ED: Medications  aspirin chewable tablet 324 mg (324 mg Oral Given 06/14/22 0432)     IMPRESSION / MDM / ASSESSMENT AND PLAN / ED COURSE  I reviewed the triage vital signs and the nursing notes.                              Patient's presentation is most consistent with acute presentation with potential threat to life or bodily function.  Differential diagnosis includes, but is not limited to, ACS including NSTEMI, unstable angina, musculoskeletal pain, GERD, less likely dissection, PE  The patient is a 42 year old male with hypertension hyperlipidemia diabetes who presents because of an episode of chest pain.  He was at work sitting down when he had sharp left-sided transient chest pain lasting about 10 seconds that did not radiate  and he had no associated symptoms.  Was pain-free until when he got to ED had another very brief episode of chest pain this is now resolved.  None of his pain has been exertional.  He does have a significantly abnormal EKG with inferolateral T wave inversions.  Was seen in the ED about 2 months ago with similar transient pain and had similar EKG abnormalities and thus was referred to cardiology and is undergoing outpatient workup.  He had echo showing normal EF with severe concentric LVH and abnormal nuclear stress test and is going to have a coronary CTA performed.  Patient is mildly hypertensive initial respiratory rate documented at 22 but he is not in any respiratory distress on my evaluation breathing normally.  Cardiopulmonary exam is essentially normal.  EKG does look abnormal but similar to prior.  First troponin is negative.  He will need repeat troponin.  Chest x-ray is read by radiology as coarsening of the interstitium in the right lung could be reflective of pulmonary edema versus atypical infection.  Patient has no infectious symptoms including cough fevers chills feels is less likely.  Does not appear clinically volume overloaded BNP is negative so we will defer starting any diuretic.  Patient is certainly high risk but this episode is quite atypical for ACS and that it was sharp and very brief nonexertional without associated symptoms.  He remains pain-free with second negative troponin I think that he can continue his outpatient workup.  Patient's repeat troponin is negative.  On reassessment he continues to have no chest pain while being in the ED.  I think that he is appropriate to continue his outpatient workup.  Did discuss with him that he should return to ED for anything but a similar short-lived episode of chest pain.       FINAL CLINICAL IMPRESSION(S) / ED DIAGNOSES   Final diagnoses:  Chest pain, unspecified type     Rx / DC Orders   ED Discharge Orders     None         Note:  This document was prepared using Dragon voice recognition software and may include unintentional dictation errors.   Rada Hay, MD 06/14/22 [phone]

## 2022-06-14 NOTE — Discharge Instructions (Addendum)
Your cardiac enzymes were negative today which is reassuring.  It is important that you follow-up with Dr. Humphrey Rolls to have your coronary CTA.  If you have recurrent chest pain that is lasting longer than several seconds or comes on when you are exerting yourself or you feel short of breath or sweaty please return to the emergency department.

## 2022-06-14 NOTE — ED Triage Notes (Signed)
Patient ambulatory to triage with steady gait, without difficulty or distress noted; pt reports onset left sided CP, nonradiating with no accomp symptoms; st pain began while at work around [employer]

## 2022-06-15 ENCOUNTER — Other Ambulatory Visit: Payer: 59

## 2022-06-19 ENCOUNTER — Other Ambulatory Visit: Payer: Self-pay | Admitting: Internal Medicine

## 2022-06-19 DIAGNOSIS — I10 Essential (primary) hypertension: Secondary | ICD-10-CM

## 2022-06-20 ENCOUNTER — Other Ambulatory Visit: Payer: 59

## 2022-06-20 ENCOUNTER — Ambulatory Visit: Payer: 59 | Admitting: Cardiovascular Disease

## 2022-06-22 ENCOUNTER — Other Ambulatory Visit: Payer: Self-pay

## 2022-06-22 DIAGNOSIS — N182 Chronic kidney disease, stage 2 (mild): Secondary | ICD-10-CM

## 2022-06-22 MED ORDER — METFORMIN HCL 500 MG PO TABS: 500.0000 mg | ORAL_TABLET | Freq: Two times a day (BID) | ORAL | 11 refills | Status: AC

## 2022-07-17 ENCOUNTER — Other Ambulatory Visit: Payer: Self-pay

## 2022-07-17 DIAGNOSIS — I10 Essential (primary) hypertension: Secondary | ICD-10-CM

## 2022-07-17 MED ORDER — AMLODIPINE-OLMESARTAN 5-20 MG PO TABS: 1.0000 | ORAL_TABLET | Freq: Every day | ORAL | 1 refills | Status: AC

## 2022-07-27 ENCOUNTER — Ambulatory Visit: Payer: Self-pay | Admitting: Internal Medicine

## 2022-08-24 ENCOUNTER — Ambulatory Visit: Payer: 59 | Admitting: Cardiovascular Disease

## 2022-08-31 ENCOUNTER — Ambulatory Visit: Payer: 59 | Admitting: Cardiovascular Disease
